# Patient Record
Sex: Female | Born: 1940
Health system: Southern US, Community
[De-identification: ages and names within clinical notes are randomized; demographics above are authoritative.]

## PROBLEM LIST (undated history)

## (undated) DIAGNOSIS — K219 Gastro-esophageal reflux disease without esophagitis: Secondary | ICD-10-CM

## (undated) DIAGNOSIS — F32A Depression, unspecified: Secondary | ICD-10-CM

## (undated) DIAGNOSIS — R7303 Prediabetes: Secondary | ICD-10-CM

## (undated) DIAGNOSIS — M1990 Unspecified osteoarthritis, unspecified site: Secondary | ICD-10-CM

## (undated) DIAGNOSIS — Z87898 Personal history of other specified conditions: Secondary | ICD-10-CM

## (undated) DIAGNOSIS — E785 Hyperlipidemia, unspecified: Secondary | ICD-10-CM

## (undated) DIAGNOSIS — F329 Major depressive disorder, single episode, unspecified: Secondary | ICD-10-CM

## (undated) DIAGNOSIS — I10 Essential (primary) hypertension: Secondary | ICD-10-CM

## (undated) DIAGNOSIS — G2581 Restless legs syndrome: Secondary | ICD-10-CM

## (undated) DIAGNOSIS — R200 Anesthesia of skin: Secondary | ICD-10-CM

## (undated) DIAGNOSIS — Z8669 Personal history of other diseases of the nervous system and sense organs: Secondary | ICD-10-CM

## (undated) HISTORY — PX: COLONOSCOPY: SHX174

## (undated) HISTORY — DX: Hyperlipidemia, unspecified: E78.5

## (undated) HISTORY — DX: Depression, unspecified: F32.A

## (undated) HISTORY — DX: Major depressive disorder, single episode, unspecified: F32.9

---

## 1982-03-17 HISTORY — PX: ABDOMINAL HYSTERECTOMY: SHX81

## 1999-12-10 ENCOUNTER — Other Ambulatory Visit: Admission: RE | Admit: 1999-12-10 | Discharge: 1999-12-10 | Payer: Self-pay | Admitting: Obstetrics and Gynecology

## 2000-12-10 ENCOUNTER — Other Ambulatory Visit: Admission: RE | Admit: 2000-12-10 | Discharge: 2000-12-10 | Payer: Self-pay | Admitting: Internal Medicine

## 2011-07-16 HISTORY — PX: CATARACT EXTRACTION: SUR2

## 2012-01-05 ENCOUNTER — Other Ambulatory Visit: Payer: Self-pay | Admitting: Obstetrics and Gynecology

## 2012-01-12 ENCOUNTER — Encounter (HOSPITAL_COMMUNITY): Payer: Self-pay | Admitting: Pharmacist

## 2012-01-22 ENCOUNTER — Encounter (HOSPITAL_COMMUNITY): Payer: Self-pay

## 2012-01-22 ENCOUNTER — Encounter (HOSPITAL_COMMUNITY)
Admission: RE | Admit: 2012-01-22 | Discharge: 2012-01-22 | Disposition: A | Discharge status: Home / Self Care | Payer: Medicare Other | Source: Ambulatory Visit | Attending: Obstetrics and Gynecology | Admitting: Obstetrics and Gynecology

## 2012-01-22 HISTORY — DX: Restless legs syndrome: G25.81

## 2012-01-22 HISTORY — DX: Essential (primary) hypertension: I10

## 2012-01-22 LAB — BASIC METABOLIC PANEL
CO2: 28 mEq/L (ref 19–32)
Calcium: 9.6 mg/dL (ref 8.4–10.5)
Creatinine, Ser: 0.55 mg/dL (ref 0.50–1.10)
GFR calc non Af Amer: >90 mL/min (ref >90)
Glucose, Bld: 88 mg/dL (ref 70–99)

## 2012-01-22 LAB — CBC
MCH: 31.7 pg (ref 26.0–34.0)
MCV: 92.9 fL (ref 78.0–100.0)
Platelets: 306 10*3/uL (ref 150–400)
RDW: 13.2 % (ref 11.5–15.5)

## 2012-01-22 NOTE — Patient Instructions (Addendum)
   Your procedure is scheduled on: Monday November 11th  Enter through the Main Entrance of [facility] at:11:00 am Pick up the phone at the desk and dial [phone] and inform us of your arrival.  Please call this number if you have any problems the morning of surgery: [phone]  Remember: Do not eat food after midnight on Sunday You may have water, gatorade, apple juice,  until 8:30 am on Monday then nothing Please take your blood pressure medicine morning of surgery  Do not wear jewelry, make-up, or FINGER nail polish No metal in your hair or on your body. Do not wear lotions, powders, perfumes. You may wear deodorant.  Please use your CHG wash as directed prior to surgery.  Do not shave anywhere for at least 12 hours prior to first CHG shower.  Do not bring valuables to the hospital. Please bring a case for your eyeglasses  Leave suitcase in the car. After Surgery it may be brought to your room. For patients being admitted to the hospital, checkout time is 11:00am the day of discharge.  Patients discharged on the day of surgery will not be allowed to drive home.

## 2012-01-22 NOTE — Pre-Procedure Instructions (Addendum)
Dr Delanna Ahmadi office called for EKG for comparison.(2004) Will do ekg today at pat visit.

## 2012-01-25 ENCOUNTER — Other Ambulatory Visit: Payer: Self-pay | Admitting: Obstetrics and Gynecology

## 2012-01-25 MED ORDER — CIPROFLOXACIN IN D5W 400 MG/200ML IV SOLN
400.0000 mg | INTRAVENOUS | Status: AC
  Administered 2012-01-26: 400 mg via INTRAVENOUS
  Filled 2012-01-25: qty 200

## 2012-01-25 MED ORDER — METRONIDAZOLE IN NACL 5-0.79 MG/ML-% IV SOLN
500.0000 mg | INTRAVENOUS | Status: AC
  Administered 2012-01-26: 500 mg via INTRAVENOUS
  Filled 2012-01-25: qty 100

## 2012-01-25 NOTE — H&P (Signed)
01/21/2012  History of Present Illness  General:  71 y/o G2P2 presents for preop for an anterior repair. Pt had complete prolapse of cystocele after lifting heavy cages with cats in them. She reported sensation of vaginal pressure that she reports as uncomfortable. She denies significant urinary incontinence or urgency prior to this incident. No abnormal discharge is noted. Pt has repeatedly inquired about the possibility of developing urinary incontinence after surgery. Although I don't think she will develop this symptom due to the presentation of her cystocele, she was previously offered a urology consult to evaluate for this and she declined. She declines again today at this preop visit and would like to go ahead and get it done. Pt is s/p hysterectomy for uterine fibroids and menorrhagia. Per my request, she has completed 6 weeks of twice weekly vaginal estrogen, Estrace, to prepare the vaginal mucosa for surgery. She also discontinued ASA 1 week ago to minimize bleeding.  She denies any fever/chills, n/v, abdominal pain, chest pain, SOB, numbness or tingling.  She has been cleared for surgery by her PCP, Dr. Carol Westerman.   Current Medications  Magnesium 400 MG Capsule 1 capsule with a meal Once a day  Lutein 6 MG Capsule 1 capsule twice a day  Vitamin D 1000 iu Capsule 1 tablet Once a day  Aspirin 81 MG Tablet Chewable 1 tablet Once a day  Multivitamins tablet Tablet 1 tablet once a day  OTC Blood Builder tablet 1 tablet 2-3 times a week  Vitamin B-12 1000 MCG Tablet 1 tablet Once a day  Red Yeast Rice 600 MG Capsule 2 tablets once a day  Fish Oil 2400 MG Capsule 1 capsule Once a day  Premarin 0.625 MG/GM Cream 1/2 appl twice weekly  Lisinopril 5 MG Tablet TAKE 1 TABLET BY MOUTH DAILY   Hydrochlorothiazide 25 MG Tablet take 1/2 tablet by mouth daily Once a day  Clonazepam 1 MG Tablet 1.5 tablet at bedtime at bedtime  Estrace 0.1 MG/GM Cream 2 grams 2 grams daily x 7 days then twice  weekly  Medication List reviewed and reconciled with the patient   Past Medical History  Hypertension  Osteoporosis? > resolved 2007 ?  Restless legs  Hyperlipidemia  Depression   Surgical History  s/p hysterectomy (benign) ovaries intact 1984  C-section x 2   bilateral cataract surgery 07-19-11   Family History  Father: deceased 60's yrs MI late 60's, heart disease   Mother: deceased 70's yrs lung/uterine cancer   Brother 1: alive 66 yrs A + W   Sister 1: alive 76 yrs melanoma, HTN   1 brother(s) , 1 sister(s) . 2daughter(s) - healthy.   Neg. f. hx. breast/colon cancer.   Social History  General:  History of smoking  cigarettes: Never smoked no Smoking.  no Tobacco Exposure.  no Alcohol.  Caffeine: yes, 2 servings daily, tea.  no Recreational drug use.  Diet: Vegetarian.  Exercise: yes, daily, walks.  Occupation: unemployed, homemaker/gardening/dogs/cats.  Marital Status: Married.  Children: 2 girls, 2 children; 4 grandchildren.  Seat belt use: yes.    Gyn History  Periods : hysterectomy, heavy bleeding, fibroids.  Denies H/O LMP .  Denies H/O Birth control .  Last mammogram date 12/05/11.    OB History  Number of pregnancies G2; P2.  Pregnancy # 1 live birth, C-section delivery, girl, 6 lbs 8oz. Labored for 8 hours.  Pregnancy # 2 live birth, C-section, girl, 6 lbs.    Allergies  Codeine (for allergy):   nausea: Side Effects  Penicillin (for allergy): rash: Allergy   Hospitalization/Major Diagnostic Procedure  see surg. hx.    Vital Signs  Wt 126, Wt change -1 lb, Ht 61.5, BMI 23.42, Pulse sitting 80, BP sitting 132/78.   Physical Examination  GENERAL:  Patient appears in NAD, pleasant.  Build: well developed.  General Appearance: well-appearing.  Race: caucasian.  LUNGS:  Breath sounds: clear to auscultation.  Dyspnea: no.  HEART:  Murmurs: none.  Rate: normal.  Rhythm: regular.  ABDOMEN:  General: no masses,tenderness,organomegaly, , BS normal,  non distended.  FEMALE GENITOURINARY:  Adnexa: no mass, non tender.  Anus/perineum: normal, no lesions.  Cervix/ cuff: cervix surgically absent, Cuff with some prolapse.  External genitalia: normal, no lesions, no skin discoloration.  Rectum: deferred.  Urethra: normal external meatus.  Uterus: surgically absent.  Vagina: no lesions, no abnormal discharge, odorless, atrophic mucosa but improved, estrogen cream noted, lower 1/3 of ant vagina stable but with Valsalva, mid 1/3 descends to the introitus. Introitus is accommodating.  Vulva: normal, no lesions, no skin discoloration.  EXTREMITIES:  Extremities no clubbing cyanosis or edema present.  NEUROLOGICAL:  gross motor and sensory grossly intact.  Orientation: alert and oriented x 3.     Assessments   1. Pre-op exam - V72.84 (Primary)   2. Cystocele - 618.01   Treatment  1. Cystocele  Discussed at length with pt rationale and R/B/A of surgery. She again declines pessary and urology referral. Informed of possible injury to bladder. Failure of repair requiring need of further surgery also discussed. Reviewed the importance of pelvic rest and lifting restricitons to avoid weakening the repair. Pt verbalizes understanding. All questions answered and consent obtained.    Follow Up  2 Weeks post op    

## 2012-01-26 ENCOUNTER — Ambulatory Visit (HOSPITAL_COMMUNITY)
Admission: AD | Admit: 2012-01-26 | Discharge: 2012-01-27 | Disposition: A | Discharge status: Home / Self Care | Payer: Medicare Other | Source: Ambulatory Visit | Attending: Obstetrics and Gynecology | Admitting: Obstetrics and Gynecology

## 2012-01-26 ENCOUNTER — Encounter (HOSPITAL_COMMUNITY): Payer: Self-pay | Admitting: Anesthesiology

## 2012-01-26 ENCOUNTER — Ambulatory Visit (HOSPITAL_COMMUNITY): Payer: Medicare Other | Admitting: Anesthesiology

## 2012-01-26 ENCOUNTER — Encounter (HOSPITAL_COMMUNITY): Payer: Self-pay | Admitting: *Deleted

## 2012-01-26 ENCOUNTER — Encounter (HOSPITAL_COMMUNITY)
Admission: AD | Disposition: A | Discharge status: Home / Self Care | Payer: Self-pay | Source: Ambulatory Visit | Attending: Obstetrics and Gynecology

## 2012-01-26 DIAGNOSIS — N993 Prolapse of vaginal vault after hysterectomy: Secondary | ICD-10-CM | POA: Insufficient documentation

## 2012-01-26 DIAGNOSIS — IMO0002 Reserved for concepts with insufficient information to code with codable children: Secondary | ICD-10-CM | POA: Diagnosis present

## 2012-01-26 HISTORY — PX: CYSTOCELE REPAIR: SHX163

## 2012-01-26 SURGERY — COLPORRHAPHY, ANTERIOR, FOR CYSTOCELE REPAIR
Anesthesia: General | Site: Vagina | Wound class: Clean Contaminated

## 2012-01-26 MED ORDER — HYDROMORPHONE HCL PF 1 MG/ML IJ SOLN: 0.2000 mg | INTRAMUSCULAR | Status: DC | PRN

## 2012-01-26 MED ORDER — HYDROCHLOROTHIAZIDE 12.5 MG PO CAPS
12.5000 mg | ORAL_CAPSULE | Freq: Every day | ORAL | Status: DC
  Filled 2012-01-26 (×2): qty 1

## 2012-01-26 MED ORDER — IBUPROFEN 600 MG PO TABS
600.0000 mg | ORAL_TABLET | Freq: Four times a day (QID) | ORAL | Status: DC | PRN
  Administered 2012-01-26: 600 mg via ORAL
  Filled 2012-01-26: qty 1

## 2012-01-26 MED ORDER — LISINOPRIL 5 MG PO TABS
5.0000 mg | ORAL_TABLET | Freq: Every day | ORAL | Status: DC
  Filled 2012-01-26 (×2): qty 1

## 2012-01-26 MED ORDER — DEXAMETHASONE SODIUM PHOSPHATE 10 MG/ML IJ SOLN
INTRAMUSCULAR | Status: AC
  Filled 2012-01-26: qty 1

## 2012-01-26 MED ORDER — CLONAZEPAM 0.5 MG PO TABS
1.5000 mg | ORAL_TABLET | Freq: Every day | ORAL | Status: DC
  Administered 2012-01-26: 1.5 mg via ORAL
  Filled 2012-01-26: qty 3

## 2012-01-26 MED ORDER — TRAMADOL HCL 50 MG PO TABS: 50.0000 mg | ORAL_TABLET | Freq: Four times a day (QID) | ORAL | Status: DC | PRN

## 2012-01-26 MED ORDER — KETOROLAC TROMETHAMINE 30 MG/ML IJ SOLN: 30.0000 mg | Freq: Once | INTRAMUSCULAR | Status: DC

## 2012-01-26 MED ORDER — LIDOCAINE HCL (CARDIAC) 20 MG/ML IV SOLN
INTRAVENOUS | Status: DC | PRN
  Administered 2012-01-26: 40 mg via INTRAVENOUS

## 2012-01-26 MED ORDER — DOCUSATE SODIUM 100 MG PO CAPS
100.0000 mg | ORAL_CAPSULE | Freq: Two times a day (BID) | ORAL | Status: DC
  Administered 2012-01-26 – 2012-01-27 (×2): 100 mg via ORAL
  Filled 2012-01-26 (×2): qty 1

## 2012-01-26 MED ORDER — ESTRADIOL 0.1 MG/GM VA CREA
TOPICAL_CREAM | VAGINAL | Status: DC | PRN
  Administered 2012-01-26: 1 via VAGINAL

## 2012-01-26 MED ORDER — MENTHOL 3 MG MT LOZG: 1.0000 | LOZENGE | OROMUCOSAL | Status: DC | PRN

## 2012-01-26 MED ORDER — DEXAMETHASONE SODIUM PHOSPHATE 10 MG/ML IJ SOLN
INTRAMUSCULAR | Status: DC | PRN
  Administered 2012-01-26: 10 mg via INTRAVENOUS

## 2012-01-26 MED ORDER — FENTANYL CITRATE 0.05 MG/ML IJ SOLN
INTRAMUSCULAR | Status: AC
  Filled 2012-01-26: qty 5

## 2012-01-26 MED ORDER — KETOROLAC TROMETHAMINE 30 MG/ML IJ SOLN: 15.0000 mg | Freq: Once | INTRAMUSCULAR | Status: DC | PRN

## 2012-01-26 MED ORDER — LACTATED RINGERS IV SOLN
INTRAVENOUS | Status: DC
  Administered 2012-01-26 – 2012-01-27 (×2): via INTRAVENOUS

## 2012-01-26 MED ORDER — MEPERIDINE HCL 25 MG/ML IJ SOLN: 6.2500 mg | INTRAMUSCULAR | Status: DC | PRN

## 2012-01-26 MED ORDER — LACTATED RINGERS IV SOLN
INTRAVENOUS | Status: DC
  Administered 2012-01-26: 14:00:00 via INTRAVENOUS
  Administered 2012-01-26 (×2): 50 mL/h via INTRAVENOUS

## 2012-01-26 MED ORDER — BUPIVACAINE-EPINEPHRINE PF 0.25-1:200000 % IJ SOLN
INTRAMUSCULAR | Status: AC
  Filled 2012-01-26: qty 30

## 2012-01-26 MED ORDER — FENTANYL CITRATE 0.05 MG/ML IJ SOLN
INTRAMUSCULAR | Status: DC | PRN
  Administered 2012-01-26: 50 ug via INTRAVENOUS
  Administered 2012-01-26: 25 ug via INTRAVENOUS
  Administered 2012-01-26: 100 ug via INTRAVENOUS
  Administered 2012-01-26: 25 ug via INTRAVENOUS
  Administered 2012-01-26: 50 ug via INTRAVENOUS

## 2012-01-26 MED ORDER — 0.9 % SODIUM CHLORIDE (POUR BTL) OPTIME
TOPICAL | Status: DC | PRN
  Administered 2012-01-26: 1000 mL

## 2012-01-26 MED ORDER — HYDROCHLOROTHIAZIDE 25 MG PO TABS
12.5000 mg | ORAL_TABLET | Freq: Every day | ORAL | Status: DC
  Filled 2012-01-26: qty 0.5

## 2012-01-26 MED ORDER — MIDAZOLAM HCL 2 MG/2ML IJ SOLN
INTRAMUSCULAR | Status: AC
  Filled 2012-01-26: qty 2

## 2012-01-26 MED ORDER — ONDANSETRON HCL 4 MG/2ML IJ SOLN
INTRAMUSCULAR | Status: DC | PRN
  Administered 2012-01-26: 4 mg via INTRAVENOUS

## 2012-01-26 MED ORDER — FENTANYL CITRATE 0.05 MG/ML IJ SOLN: 25.0000 ug | INTRAMUSCULAR | Status: DC | PRN

## 2012-01-26 MED ORDER — PROMETHAZINE HCL 25 MG/ML IJ SOLN: 6.2500 mg | INTRAMUSCULAR | Status: DC | PRN

## 2012-01-26 MED ORDER — BUPIVACAINE-EPINEPHRINE 0.25% -1:200000 IJ SOLN
INTRAMUSCULAR | Status: DC | PRN
  Administered 2012-01-26: 30 mL

## 2012-01-26 MED ORDER — SIMETHICONE 80 MG PO CHEW: 80.0000 mg | CHEWABLE_TABLET | Freq: Four times a day (QID) | ORAL | Status: DC | PRN

## 2012-01-26 MED ORDER — ONDANSETRON HCL 4 MG/2ML IJ SOLN
INTRAMUSCULAR | Status: AC
  Filled 2012-01-26: qty 2

## 2012-01-26 MED ORDER — LIDOCAINE HCL (CARDIAC) 20 MG/ML IV SOLN
INTRAVENOUS | Status: AC
  Filled 2012-01-26: qty 5

## 2012-01-26 MED ORDER — PROPOFOL 10 MG/ML IV BOLUS
INTRAVENOUS | Status: DC | PRN
  Administered 2012-01-26: 130 mg via INTRAVENOUS

## 2012-01-26 MED ORDER — PROPOFOL 10 MG/ML IV EMUL
INTRAVENOUS | Status: AC
  Filled 2012-01-26: qty 20

## 2012-01-26 MED ORDER — ESTRADIOL 0.1 MG/GM VA CREA
TOPICAL_CREAM | VAGINAL | Status: AC
  Filled 2012-01-26: qty 42.5

## 2012-01-26 MED ORDER — PANTOPRAZOLE SODIUM 40 MG PO TBEC
40.0000 mg | DELAYED_RELEASE_TABLET | Freq: Every day | ORAL | Status: DC
  Administered 2012-01-27: 40 mg via ORAL
  Filled 2012-01-26 (×3): qty 1

## 2012-01-26 MED ORDER — ONDANSETRON HCL 4 MG/2ML IJ SOLN
4.0000 mg | Freq: Four times a day (QID) | INTRAMUSCULAR | Status: DC | PRN
  Administered 2012-01-26: 4 mg via INTRAVENOUS
  Filled 2012-01-26: qty 2

## 2012-01-26 MED ORDER — MAGNESIUM OXIDE 400 (241.3 MG) MG PO TABS
400.0000 mg | ORAL_TABLET | Freq: Every day | ORAL | Status: DC
  Filled 2012-01-26 (×2): qty 1

## 2012-01-26 MED ORDER — ONDANSETRON HCL 4 MG PO TABS: 4.0000 mg | ORAL_TABLET | Freq: Four times a day (QID) | ORAL | Status: DC | PRN

## 2012-01-26 MED ORDER — MIDAZOLAM HCL 5 MG/5ML IJ SOLN
INTRAMUSCULAR | Status: DC | PRN
  Administered 2012-01-26: 2 mg via INTRAVENOUS

## 2012-01-26 MED ORDER — MIDAZOLAM HCL 2 MG/2ML IJ SOLN: 0.5000 mg | Freq: Once | INTRAMUSCULAR | Status: DC | PRN

## 2012-01-26 SURGICAL SUPPLY — 25 items
CATH ROBINSON RED A/P 16FR (CATHETERS) ×3 IMPLANT
CLOTH BEACON ORANGE TIMEOUT ST (SAFETY) ×3 IMPLANT
DRAPE HYSTEROSCOPY (DRAPE) ×3 IMPLANT
GAUZE PACKING 2X5 YD STERILE (GAUZE/BANDAGES/DRESSINGS) ×3 IMPLANT
GLOVE BIO SURGEON STRL SZ 6 (GLOVE) ×3 IMPLANT
GLOVE BIO SURGEON STRL SZ7 (GLOVE) ×3 IMPLANT
GLOVE BIOGEL M 6.5 STRL (GLOVE) ×3 IMPLANT
GLOVE BIOGEL PI IND STRL 7.0 (GLOVE) ×8 IMPLANT
GLOVE BIOGEL PI INDICATOR 7.0 (GLOVE) ×4
GLOVE ECLIPSE 7.0 STRL STRAW (GLOVE) ×3 IMPLANT
GOWN PREVENTION PLUS LG XLONG (DISPOSABLE) ×15 IMPLANT
NS IRRIG 1000ML POUR BTL (IV SOLUTION) ×3 IMPLANT
PACK VAGINAL WOMENS (CUSTOM PROCEDURE TRAY) ×3 IMPLANT
SUT VIC AB 0 CT1 18XCR BRD8 (SUTURE) ×2 IMPLANT
SUT VIC AB 0 CT1 27 (SUTURE)
SUT VIC AB 0 CT1 27XBRD ANBCTR (SUTURE) IMPLANT
SUT VIC AB 0 CT1 8-18 (SUTURE) ×1
SUT VIC AB 2-0 CT1 27 (SUTURE)
SUT VIC AB 2-0 CT1 TAPERPNT 27 (SUTURE) IMPLANT
SUT VIC AB 2-0 SH 27 (SUTURE) ×2
SUT VIC AB 2-0 SH 27XBRD (SUTURE) ×4 IMPLANT
SUT VIC AB 2-0 UR5 27 (SUTURE) IMPLANT
TOWEL OR 17X24 6PK STRL BLUE (TOWEL DISPOSABLE) ×6 IMPLANT
TRAY FOLEY CATH 14FR (SET/KITS/TRAYS/PACK) IMPLANT
WATER STERILE IRR 1000ML POUR (IV SOLUTION) ×3 IMPLANT

## 2012-01-26 NOTE — Brief Op Note (Signed)
01/26/2012  3:08 PM  PATIENT:  Ernestene Kiel  71 y.o. female  PRE-OPERATIVE DIAGNOSIS:  Cystocele  POST-OPERATIVE DIAGNOSIS:  cystocele  PROCEDURE:  Procedure(s) (LRB) with comments: ANTERIOR REPAIR (CYSTOCELE) (N/A)  SURGEON:  Surgeon(s) and Role:    * Geryl Rankins, MD - Primary    * Dorien Chihuahua. Richardson Dopp, MD - Assisting  PHYSICIAN ASSISTANT: Dr. Richardson Dopp  ASSISTANTS: Technician   ANESTHESIA:   general  EBL:  Total I/O In: 1000 [I.V.:1000] Out: 325 [Urine:250; Blood:75]  BLOOD ADMINISTERED:none  DRAINS: Urinary Catheter (Foley)   LOCAL MEDICATIONS USED:  MARCAINE   With epinephrine  SPECIMEN:  No Specimen  DISPOSITION OF SPECIMEN:  None  COUNTS:  YES  TOURNIQUET:  * No tourniquets in log *  DICTATION: .Other Dictation: Dictation Number [id number]  PLAN OF CARE: Admit for overnight observation  PATIENT DISPOSITION:  PACU - hemodynamically stable.   Delay start of Pharmacological VTE agent (>24hrs) due to surgical blood loss or risk of bleeding: not applicable

## 2012-01-26 NOTE — Anesthesia Postprocedure Evaluation (Signed)
Anesthesia Post Note  Patient: Lisa Morton  Procedure(s) Performed: Procedure(s) (LRB): ANTERIOR REPAIR (CYSTOCELE) (N/A)  Anesthesia type: GA  Patient location: PACU  Post pain: Pain level controlled  Post assessment: Post-op Vital signs reviewed  Last Vitals:  Filed Vitals:   01/26/12 1545  BP: 96/79  Pulse: 78  Temp:   Resp: 16    Post vital signs: Reviewed  Level of consciousness: sedated  Complications: No apparent anesthesia complications

## 2012-01-26 NOTE — H&P (View-Only) (Signed)
01/21/2012  History of Present Illness  General:  71 y/o G2P2 presents for preop for an anterior repair. Pt had complete prolapse of cystocele after lifting heavy cages with cats in them. She reported sensation of vaginal pressure that she reports as uncomfortable. She denies significant urinary incontinence or urgency prior to this incident. No abnormal discharge is noted. Pt has repeatedly inquired about the possibility of developing urinary incontinence after surgery. Although I don't think she will develop this symptom due to the presentation of her cystocele, she was previously offered a urology consult to evaluate for this and she declined. She declines again today at this preop visit and would like to go ahead and get it done. Pt is s/p hysterectomy for uterine fibroids and menorrhagia. Per my request, she has completed 6 weeks of twice weekly vaginal estrogen, Estrace, to prepare the vaginal mucosa for surgery. She also discontinued ASA 1 week ago to minimize bleeding.  She denies any fever/chills, n/v, abdominal pain, chest pain, SOB, numbness or tingling.  She has been cleared for surgery by her PCP, Dr. Silvestre Moment.   Current Medications  Magnesium 400 MG Capsule 1 capsule with a meal Once a day  Lutein 6 MG Capsule 1 capsule twice a day  Vitamin D 1000 iu Capsule 1 tablet Once a day  Aspirin 81 MG Tablet Chewable 1 tablet Once a day  Multivitamins tablet Tablet 1 tablet once a day  OTC Blood Builder tablet 1 tablet 2-3 times a week  Vitamin B-12 1000 MCG Tablet 1 tablet Once a day  Red Yeast Rice 600 MG Capsule 2 tablets once a day  Fish Oil 2400 MG Capsule 1 capsule Once a day  Premarin 0.625 MG/GM Cream 1/2 appl twice weekly  Lisinopril 5 MG Tablet TAKE 1 TABLET BY MOUTH DAILY   Hydrochlorothiazide 25 MG Tablet take 1/2 tablet by mouth daily Once a day  Clonazepam 1 MG Tablet 1.5 tablet at bedtime at bedtime  Estrace 0.1 MG/GM Cream 2 grams 2 grams daily x 7 days then twice  weekly  Medication List reviewed and reconciled with the patient   Past Medical History  Hypertension  Osteoporosis? > resolved 2007 ?  Restless legs  Hyperlipidemia  Depression   Surgical History  s/p hysterectomy (benign) ovaries intact 1984  C-section x 2   bilateral cataract surgery 07-19-11   Family History  Father: deceased 4's yrs MI late 15's, heart disease   Mother: deceased 71's yrs lung/uterine cancer   Brother 1: alive 20 yrs A + W   Sister 1: alive 7 yrs melanoma, HTN   1 brother(s) , 1 sister(s) . 2daughter(s) - healthy.   Neg. f. hx. breast/colon cancer.   Social History  General:  History of smoking  cigarettes: Never smoked no Smoking.  no Tobacco Exposure.  no Alcohol.  Caffeine: yes, 2 servings daily, tea.  no Recreational drug use.  Diet: Vegetarian.  Exercise: yes, daily, walks.  Occupation: unemployed, homemaker/gardening/dogs/cats.  Marital Status: Married.  Children: 2 girls, 2 children; 4 grandchildren.  Seat belt use: yes.    Gyn History  Periods : hysterectomy, heavy bleeding, fibroids.  Denies H/O LMP .  Denies H/O Birth control .  Last mammogram date 12/05/11.    OB History  Number of pregnancies G2; P2.  Pregnancy # 1 live birth, C-section delivery, girl, 6 lbs 8oz. Labored for 8 hours.  Pregnancy # 2 live birth, C-section, girl, 6 lbs.    Allergies  Codeine (for allergy):  nausea: Side Effects  Penicillin (for allergy): rash: Allergy   Hospitalization/Major Diagnostic Procedure  see surg. hx.    Vital Signs  Wt 126, Wt change -1 lb, Ht 61.5, BMI 23.42, Pulse sitting 80, BP sitting 132/78.   Physical Examination  GENERAL:  Patient appears in NAD, pleasant.  Build: well developed.  General Appearance: well-appearing.  Race: caucasian.  LUNGS:  Breath sounds: clear to auscultation.  Dyspnea: no.  HEART:  Murmurs: none.  Rate: normal.  Rhythm: regular.  ABDOMEN:  General: no masses,tenderness,organomegaly, , BS normal,  non distended.  FEMALE GENITOURINARY:  Adnexa: no mass, non tender.  Anus/perineum: normal, no lesions.  Cervix/ cuff: cervix surgically absent, Cuff with some prolapse.  External genitalia: normal, no lesions, no skin discoloration.  Rectum: deferred.  Urethra: normal external meatus.  Uterus: surgically absent.  Vagina: no lesions, no abnormal discharge, odorless, atrophic mucosa but improved, estrogen cream noted, lower 1/3 of ant vagina stable but with Valsalva, mid 1/3 descends to the introitus. Introitus is accommodating.  Vulva: normal, no lesions, no skin discoloration.  EXTREMITIES:  Extremities no clubbing cyanosis or edema present.  NEUROLOGICAL:  gross motor and sensory grossly intact.  Orientation: alert and oriented x 3.     Assessments   1. Pre-op exam - V72.84 (Primary)   2. Cystocele - 618.01   Treatment  1. Cystocele  Discussed at length with pt rationale and R/B/A of surgery. She again declines pessary and urology referral. Informed of possible injury to bladder. Failure of repair requiring need of further surgery also discussed. Reviewed the importance of pelvic rest and lifting restricitons to avoid weakening the repair. Pt verbalizes understanding. All questions answered and consent obtained.    Follow Up  2 Weeks post op

## 2012-01-26 NOTE — Interval H&P Note (Signed)
History and Physical Interval Note:  01/26/2012 1:18 PM  Lisa Morton  has presented today for surgery, with the diagnosis of Cystocele  The various methods of treatment have been discussed with the patient and family. After consideration of risks, benefits and other options for treatment, the patient has consented to  Procedure(s) (LRB) with comments: ANTERIOR (CYSTOCELE) AND POSTERIOR REPAIR (RECTOCELE) (N/A) - Anterior Repair  as a surgical intervention .  The patient's history has been reviewed, patient examined, no change in status, stable for surgery.  I have reviewed the patient's chart and labs.  Questions were answered to the patient's satisfaction.     Dion Body, Lisa Morton

## 2012-01-26 NOTE — Anesthesia Preprocedure Evaluation (Signed)
Anesthesia Evaluation  Patient identified by MRN, date of birth, ID band Patient awake    Reviewed: Allergy & Precautions, H&P , Patient's Chart, lab work & pertinent test results, reviewed documented beta blocker date and time   History of Anesthesia Complications Negative for: history of anesthetic complications  Airway Mallampati: II TM Distance: >3 FB Neck ROM: full    Dental No notable dental hx.    Pulmonary neg pulmonary ROS,  breath sounds clear to auscultation  Pulmonary exam normal       Cardiovascular Exercise Tolerance: Good hypertension, negative cardio ROS  Rhythm:regular Rate:Normal     Neuro/Psych negative neurological ROS  negative psych ROS   GI/Hepatic negative GI ROS, Neg liver ROS,   Endo/Other  negative endocrine ROS  Renal/GU negative Renal ROS     Musculoskeletal   Abdominal   Peds  Hematology negative hematology ROS (+)   Anesthesia Other Findings Hypertension     Restless leg syndrome   Reproductive/Obstetrics negative OB ROS                           Anesthesia Physical Anesthesia Plan  ASA: II  Anesthesia Plan: General LMA   Post-op Pain Management:    Induction:   Airway Management Planned:   Additional Equipment:   Intra-op Plan:   Post-operative Plan:   Informed Consent: I have reviewed the patients History and Physical, chart, labs and discussed the procedure including the risks, benefits and alternatives for the proposed anesthesia with the patient or authorized representative who has indicated his/her understanding and acceptance.   Dental Advisory Given  Plan Discussed with: CRNA and Surgeon  Anesthesia Plan Comments:         Anesthesia Quick Evaluation

## 2012-01-26 NOTE — Transfer of Care (Signed)
Immediate Anesthesia Transfer of Care Note  Patient: Lisa Morton  Procedure(s) Performed: Procedure(s) (LRB) with comments: ANTERIOR REPAIR (CYSTOCELE) (N/A)  Patient Location: PACU  Anesthesia Type:General  Level of Consciousness: sedated  Airway & Oxygen Therapy: Patient Spontanous Breathing and Patient connected to nasal cannula oxygen  Post-op Assessment: Report given to PACU RN and Post -op Vital signs reviewed and stable  Post vital signs: stable  Complications: No apparent anesthesia complications

## 2012-01-27 ENCOUNTER — Encounter (HOSPITAL_COMMUNITY): Payer: Self-pay | Admitting: Obstetrics and Gynecology

## 2012-01-27 DIAGNOSIS — IMO0002 Reserved for concepts with insufficient information to code with codable children: Secondary | ICD-10-CM | POA: Diagnosis present

## 2012-01-27 LAB — CBC
HCT: 34.5 % — ABNORMAL LOW (ref 36.0–46.0)
Platelets: 274 10*3/uL (ref 150–400)
RBC: 3.81 MIL/uL — ABNORMAL LOW (ref 3.87–5.11)
RDW: 13 % (ref 11.5–15.5)
WBC: 15.5 10*3/uL — ABNORMAL HIGH (ref 4.0–10.5)

## 2012-01-27 MED ORDER — IBUPROFEN 600 MG PO TABS: 600.0000 mg | ORAL_TABLET | Freq: Four times a day (QID) | ORAL | Status: DC | PRN

## 2012-01-27 MED ORDER — TRAMADOL HCL 50 MG PO TABS: 50.0000 mg | ORAL_TABLET | Freq: Four times a day (QID) | ORAL | Status: DC | PRN

## 2012-01-27 MED ORDER — DIPHENHYDRAMINE HCL 25 MG PO CAPS
50.0000 mg | ORAL_CAPSULE | Freq: Four times a day (QID) | ORAL | Status: DC | PRN
  Administered 2012-01-27: 50 mg via ORAL
  Filled 2012-01-27: qty 2

## 2012-01-27 NOTE — Progress Notes (Signed)
Ambulated out  Teaching complete  Pt has voided  adequately

## 2012-01-27 NOTE — Progress Notes (Signed)
1 Day Post-Op Procedure(s) (LRB): ANTERIOR REPAIR (CYSTOCELE) (N/A)  Subjective: Patient reports no problems voiding.  Pain is 0/10.  Scant bleeding on pad per RN.    Objective: I have reviewed patient's vital signs, intake and output and labs.  General: alert, cooperative and no distress GI: normal findings: soft, non-tender Extremities: extremities normal, atraumatic, no cyanosis or edema  Assessment: s/p Procedure(s) (LRB) with comments: ANTERIOR REPAIR (CYSTOCELE) (N/A): stable, progressing well and tolerating diet  Plan: Discharge home Pelvic rest and lifting restrictions. Hold BP medications if BP remains low.  Pt to take in am. Hold ASA at least 7 days to minimize bleeding.  LOS: 1 day    Lisa Morton 01/27/2012, 8:39 AM

## 2012-01-27 NOTE — Addendum Note (Signed)
Addendum  created 01/27/12 2130 by Shanon Payor, CRNA   Modules edited:Notes Section

## 2012-01-27 NOTE — Discharge Summary (Signed)
Physician Discharge Summary  Patient ID: Lisa Morton MRN: [id number] DOB/AGE: Jul 04, 1940 71 y.o.  Admit date: 01/26/2012 Discharge date: 01/27/2012  Admission Diagnoses:  Cystocele  Discharge Diagnoses: S/p Anterior repair Active Problems:  * No active hospital problems. *    Discharged Condition: good  Hospital Course: Pain well controlled overnight after surgery.  Pt did not require narcotics.  Packing removed with min-moderate amount of blood.  Spotting in am of discharge.  UOP normal.  Consults: None  Significant Diagnostic Studies: None  Treatments: surgery: See above  Discharge Exam: Blood pressure 110/48, pulse 73, temperature 97.4 F (36.3 C), temperature source Oral, resp. rate 16, height 5' 1.5" (1.562 m), weight 56.7 kg (125 lb), SpO2 98.00%. WNL day of discharge.  See progress note.  Disposition: Final discharge disposition not confirmed  Discharge Orders    Future Orders Please Complete By Expires   Diet - low sodium heart healthy      Increase activity slowly      Discharge instructions      Comments:   See discharge instructions.   Sexual Activity Restrictions      Comments:   Nothing in the vagina for 6 weeks.   Lifting restrictions      Comments:   Limit lifting over 15 pounds.   No wound care      Call MD for:  temperature >100.4      Call MD for:  persistant nausea and vomiting      Call MD for:  severe uncontrolled pain      Call MD for:  redness, tenderness, or signs of infection (pain, swelling, redness, odor or green/yellow discharge around incision site)      Call MD for:  extreme fatigue      Call MD for:  persistant dizziness or light-headedness          Medication List     As of 01/27/2012  8:40 AM    STOP taking these medications         aspirin EC 81 MG tablet      conjugated estrogens vaginal cream   Commonly known as: PREMARIN      TAKE these medications         CENTRUM SILVER ADULT 50+ PO   Take 1 tablet by mouth  daily.      cholecalciferol 1000 UNITS tablet   Commonly known as: VITAMIN D   Take 1,000 Units by mouth daily.      clonazePAM 1 MG tablet   Commonly known as: KLONOPIN   Take 1.5 mg by mouth at bedtime.      fish oil-omega-3 fatty acids 1000 MG capsule   Take 2 g by mouth daily.      hydrochlorothiazide 25 MG tablet   Commonly known as: HYDRODIURIL   Take 12.5 mg by mouth daily.      ibuprofen 600 MG tablet   Commonly known as: ADVIL,MOTRIN   Take 1 tablet (600 mg total) by mouth every 6 (six) hours as needed (mild pain).      lisinopril 5 MG tablet   Commonly known as: PRINIVIL,ZESTRIL   Take 5 mg by mouth daily.      Lutein 10 MG Tabs   Take 10 mg by mouth 3 (three) times a week.      magnesium oxide 400 MG tablet   Commonly known as: MAG-OX   Take 400 mg by mouth daily.      OVER THE COUNTER MEDICATION  Take 1 capsule by mouth 2 (two) times a week. Blood Builder- contains Vitamin C, Folate, Vitamin B12, and Iron. Takes on Wed and Sunday.      Red Yeast Rice 600 MG Caps   Take 1,200 mg by mouth daily.      traMADol 50 MG tablet   Commonly known as: ULTRAM   Take 1 tablet (50 mg total) by mouth every 6 (six) hours as needed.      vitamin B-12 1000 MCG tablet   Commonly known as: CYANOCOBALAMIN   Take 1,000 mcg by mouth daily. Takes every day except Wednesday and Sunday           Follow-up Information    Follow up with Geryl Rankins, MD. Schedule an appointment as soon as possible for a visit in 2 weeks. (Post op visit.)    Contact information:   [address], [address] [place] [address] [phone]          Signed: Geryl Rankins 01/27/2012, 8:40 AM

## 2012-01-27 NOTE — Anesthesia Postprocedure Evaluation (Signed)
  Anesthesia Post-op Note  Patient: Lisa Morton  Procedure(s) Performed: Procedure(s) (LRB) with comments: ANTERIOR REPAIR (CYSTOCELE) (N/A)  Patient Location: [facility]  Anesthesia Type:General  Level of Consciousness: awake, alert  and oriented  Airway and Oxygen Therapy: Patient Spontanous Breathing  Post-op Pain: none  Post-op Assessment: Post-op Vital signs reviewed and Patient's Cardiovascular Status Stable  Post-op Vital Signs: Reviewed and stable  Complications: No apparent anesthesia complications

## 2012-02-06 NOTE — Op Note (Signed)
[name], Lisa Morton              ACCOUNT NO.:  [id number]  MEDICAL RECORD NO.:  [id number]  LOCATION:  9319                          FACILITY:  [facility]  PHYSICIAN:  Pieter Partridge, MD   DATE OF BIRTH:  27-Nov-1940  DATE OF PROCEDURE:  01/26/2012 DATE OF DISCHARGE:  01/27/2012                              OPERATIVE REPORT   PREOPERATIVE DIAGNOSIS:  Cystocele.  POSTOPERATIVE DIAGNOSIS:  Cystocele.  PROCEDURE:  Anterior repair.  SURGEON:  Pieter Partridge, MD.  ASSISTANT:  Gerald Leitz, MD and technician.  ANESTHESIA:  General.  ESTIMATED BLOOD LOSS:  75.  URINE OUTPUT:  250 mL of clear urine.  DRAINS:  Foley catheter.  Local Marcaine without epinephrine.  SPECIMENS:  None.  DISPOSITION:  To PACU hemodynamically stable.  PLAN OF CARE:  Admit for overnight observation.  COMPLICATIONS:  None.  FINDINGS:  Grade 3-4 cystocele.  Previously atrophic mucosa and improved significantly status post estrogen therapy.  Repair of pelvic support status post repair was successful.  PROCEDURE IN DETAIL:  [name] was identified in the holding area.  She was then taken to the operating room with IV running.  She was placed in the dorsal supine position and underwent general endotracheal anesthesia without complication.  She was then placed in the dorsal lithotomy position and prepped and draped in the normal sterile fashion and positioned for the procedure.  The patient has had a hysterectomy so the apex of the vagina was palpated and then visualized and grasped at the corners with 2 Allis clamps.  A transverse incision was then made into the vaginal mucosa and the mucosa was then undermined with the Metzenbaum scissors and the incision was extended in a T-shape vertically up to the about 1-2 cm below the urethral meatus.  Of note, her cystocele was not involving the urethra and the defect started maybe 2 cm after that, so the incision was extended up to that point.  The space was  then opened with the Metzenbaum scissors and a gentle dissection was done to open up the space in the endopelvic fascia.  Once that was identified, a series of 0 Vicryl sutures and figure-of-eight fashion were placed, had 3 of those particular sutures that was then tied down to provide excellent pelvic support.  Once those sutures were tied, the vaginal mucosa was then trimmed with the curved Mayo scissors.  The mucosa was then brought together with 2-0 Vicryl in a continuous running fashion.  There was an appropriate amount of bleeding noted prior to placing my support sutures.  Once those were tied down the bleeding significantly decreased.  I did place a vaginal pack in the vagina with Estrace for adequate hemostasis that would be removed in approximately 6 hours postop.  The Foley catheter remained in place due to the vaginal packing.  The patient tolerated the procedure well.  All instrument, sponge, and needle counts were correct x3.  She got antibiotics prior to the procedure and had SCDs on throughout the case.     Pieter Partridge, MD     EBV/MEDQ  D:  02/05/2012  T:  02/06/2012  Job:  [id number]

## 2015-07-19 ENCOUNTER — Other Ambulatory Visit: Payer: Self-pay | Admitting: Family Medicine

## 2015-07-19 ENCOUNTER — Ambulatory Visit
Admission: RE | Admit: 2015-07-19 | Discharge: 2015-07-19 | Disposition: A | Discharge status: Home / Self Care | Payer: Medicare Other | Source: Ambulatory Visit | Attending: Family Medicine | Admitting: Family Medicine

## 2015-07-19 DIAGNOSIS — M545 Low back pain: Secondary | ICD-10-CM

## 2016-06-20 ENCOUNTER — Other Ambulatory Visit: Payer: Self-pay | Admitting: Family Medicine

## 2016-06-20 DIAGNOSIS — R4020 Unspecified coma: Secondary | ICD-10-CM

## 2016-06-23 ENCOUNTER — Other Ambulatory Visit: Payer: Self-pay | Admitting: Family Medicine

## 2016-06-23 DIAGNOSIS — R4020 Unspecified coma: Secondary | ICD-10-CM

## 2016-06-30 ENCOUNTER — Ambulatory Visit
Admission: RE | Admit: 2016-06-30 | Discharge: 2016-06-30 | Disposition: A | Discharge status: Home / Self Care | Payer: Medicare Other | Source: Ambulatory Visit | Attending: Family Medicine | Admitting: Family Medicine

## 2016-06-30 DIAGNOSIS — R4020 Unspecified coma: Secondary | ICD-10-CM

## 2016-07-01 ENCOUNTER — Ambulatory Visit
Admission: RE | Admit: 2016-07-01 | Discharge: 2016-07-01 | Disposition: A | Discharge status: Home / Self Care | Payer: Medicare Other | Source: Ambulatory Visit | Attending: Family Medicine | Admitting: Family Medicine

## 2016-07-01 DIAGNOSIS — R4020 Unspecified coma: Secondary | ICD-10-CM

## 2016-07-01 MED ORDER — GADOBENATE DIMEGLUMINE 529 MG/ML IV SOLN
12.0000 mL | Freq: Once | INTRAVENOUS | Status: AC | PRN
  Administered 2016-07-01: 12 mL via INTRAVENOUS

## 2016-07-10 ENCOUNTER — Encounter: Payer: Self-pay | Admitting: Neurology

## 2016-07-10 ENCOUNTER — Ambulatory Visit (INDEPENDENT_AMBULATORY_CARE_PROVIDER_SITE_OTHER): Payer: Medicare Other | Admitting: Neurology

## 2016-07-10 VITALS — BP 113/69 | HR 80 | Ht 62.0 in | Wt 125.0 lb

## 2016-07-10 DIAGNOSIS — M79605 Pain in left leg: Secondary | ICD-10-CM

## 2016-07-10 DIAGNOSIS — R29898 Other symptoms and signs involving the musculoskeletal system: Secondary | ICD-10-CM | POA: Diagnosis not present

## 2016-07-10 DIAGNOSIS — Z87898 Personal history of other specified conditions: Secondary | ICD-10-CM | POA: Diagnosis not present

## 2016-07-10 DIAGNOSIS — M545 Low back pain: Secondary | ICD-10-CM

## 2016-07-10 DIAGNOSIS — W19XXXS Unspecified fall, sequela: Secondary | ICD-10-CM

## 2016-07-10 NOTE — Patient Instructions (Addendum)
I am not sure, what caused your fall and the loss of consciousness. Your MRI brain and carotid doppler ultrasound were reassuring.  To be sure, we will do an EEG (brainwave test), which we will schedule. We will call you with the results. You have degenerative changes to your L spine and your left leg is weaker than the R.   We will do a lumbar spine (i.e. lower back) MRI to look for degenerative changes and for signs or nerve root compression.   We will do an EMG and nerve conduction velocity test, which is an electrical nerve and muscle test, which we will schedule. We will call you with the results.  We may consider a neck MRI if needed.

## 2016-07-10 NOTE — Progress Notes (Signed)
Subjective:    Patient ID: Lisa Morton is a 76 y.o. female.  HPI     Star Age, MD, PhD [facility] [address]O. [place], [place] [address]  Dear Dr. Inda Merlin,   I saw your patient, Lisa Morton, upon your kind request in my neurologic clinic today for initial consultation of her recent fall with loss of consciousness, concern for syncopal spell. The patient is unaccompanied today. As you know, Lisa Morton is a 76 year old right-handed woman with an underlying medical history of hyperlipidemia, depression, osteoporosis, hypertension, restless legs, who had a recent fall with loss of consciousness earlier this month. I reviewed your office note from 06/19/2016. She had a fall while walking the dog, she was on the phone with her daughter at the time, developed some shortness of breath, no chest pain or dizziness or vertigo but fell and went down on her knees and injured her face. She had some brief loss of consciousness as her daughter could not talked her any longer. She had facial pain. You ordered a brain MRI and carotid Doppler studies. She had a brain MRI with and without contrast on 07/01/2016 which I reviewed: IMPRESSION: No acute intracranial finding. Mild age related volume loss. No evidence of focal brain insult. Some fluid in the mastoid air cells on the right, usually not clinically relevant. She had carotid Doppler ultrasound bilaterally on 06/30/2016 and I reviewed the results: IMPRESSION: Less than 50% stenosis in the right and left internal carotid arteries.   She currently feels Close to baseline. She denies any lightheadedness, had eaten that day, does not recall having had any physical exhaustion. It was in the evening and she was walking the dog, it was close to dark and therefore she was on the phone with her daughter for safety. She usually does not talk on the phone and walk at the same time. She has no history of loss of  consciousness or Syncope before. She has a history of left-sided low back pain with radiation to the left leg. She had an x-ray to the lumbar spine in May 2017 which showed degenerative changes. She has not had an MRI lumbar spine. She also reports neck discomfort from time to time, crepitation in her neck and limitation to neck turns at times but no radiating pain typically to arms or legs. She does have some left leg weakness which she believes has been stable and not the result of the fall.She tries to stay well-hydrated. She had eaten supper that day. She had not taken her clonazepam and usually only takes it at bedtime.she denies any palpitations or chest pain at the time. She was not upset or stressed.   Her Past Medical History Is Significant For: Past Medical History:  Diagnosis Date  . Depression   . Hyperlipemia   . Hypertension   . Restless leg syndrome    on klonidipin    Her Past Surgical History Is Significant For: Past Surgical History:  Procedure Laterality Date  . ABDOMINAL HYSTERECTOMY  1984  . CATARACT EXTRACTION  07/2011  . CESAREAN SECTION    . CYSTOCELE REPAIR  01/26/2012   Procedure: ANTERIOR REPAIR (CYSTOCELE);  Surgeon: Thurnell Lose, MD;  Location: [facility];  Service: Gynecology;  Laterality: N/A;    Her Family History Is Significant For: No family history on file.  Her Social History Is Significant For: Social History   Social History  . Marital status: Married    Spouse name:  N/A  . Number of children: N/A  . Years of education: N/A   Social History Main Topics  . Smoking status: Never Smoker  . Smokeless tobacco: Never Used  . Alcohol use No  . Drug use: No  . Sexual activity: Not on file   Other Topics Concern  . Not on file   Social History Narrative  . No narrative on file    Her Allergies Are:  Allergies  Allergen Reactions  . Codeine Nausea And Vomiting  . Penicillins Rash  :   Her Current Medications Are:  Outpatient Encounter  Prescriptions as of 07/10/2016  Medication Sig  . aspirin EC 81 MG tablet Take 81 mg by mouth daily.  . Calcium-Magnesium-Zinc (CAL-MAG-ZINC PO) Take by mouth.  . cholecalciferol (VITAMIN D) 1000 UNITS tablet Take 1,000 Units by mouth daily.  . clonazePAM (KLONOPIN) 1 MG tablet Take 1 mg by mouth at bedtime.   . fish oil-omega-3 fatty acids 1000 MG capsule Take 2 g by mouth daily.  . hydrochlorothiazide (HYDRODIURIL) 25 MG tablet Take 12.5 mg by mouth daily.  Marland Kitchen ibuprofen (ADVIL,MOTRIN) 600 MG tablet Take 1 tablet (600 mg total) by mouth every 6 (six) hours as needed (mild pain).  Marland Kitchen lisinopril (PRINIVIL,ZESTRIL) 5 MG tablet Take 5 mg by mouth daily.  . Multiple Vitamins-Minerals (CENTRUM SILVER ADULT 50+ PO) Take 1 tablet by mouth daily.  Marland Kitchen OVER THE COUNTER MEDICATION Take 1 capsule by mouth 3 (three) times a week. Blood Builder- contains Vitamin C, Folate, Vitamin B12, and Iron. Takes on Wed and Sunday.  . Red Yeast Rice 600 MG CAPS Take 2,400 mg by mouth daily.   . sertraline (ZOLOFT) 100 MG tablet Take 100 mg by mouth daily.  . vitamin B-12 (CYANOCOBALAMIN) 1000 MCG tablet Take 1,000 mcg by mouth 4 (four) times a week.   . [DISCONTINUED] HYDROcodone-acetaminophen (NORCO/VICODIN) 5-325 MG tablet Take 1 tablet by mouth every 6 (six) hours as needed for moderate pain.  . [DISCONTINUED] Lutein 10 MG TABS Take 10 mg by mouth 3 (three) times a week.  . [DISCONTINUED] magnesium oxide (MAG-OX) 400 MG tablet Take 400 mg by mouth daily.  . [DISCONTINUED] traMADol (ULTRAM) 50 MG tablet Take 1 tablet (50 mg total) by mouth every 6 (six) hours as needed.   No facility-administered encounter medications on file as of 07/10/2016.    Review of Systems:  Out of a complete 14 point review of systems, all are reviewed and negative with the exception of these symptoms as listed below:  Review of Systems  Neurological:       Pt presents today to discuss a fall she had 3 weeks ago. She does not remember parts  of the event.    Objective:  Neurologic Exam  Physical Exam Physical Examination:   Vitals:   07/10/16 1002  BP: 113/69  Pulse: 80    General Examination: The patient is a very pleasant 76 y.o. female in no acute distress. She appears well-developed and well-nourished and well groomed. Mildly frail-appearing, mildly anxious appearing.  HEENT: Normocephalic, atraumatic, pupils are equal, round and reactive to light and accommodation. Funduscopic exam is normal with sharp disc margins noted. Extraocular tracking is good without limitation to gaze excursion or nystagmus noted. Normal smooth pursuit is noted. Hearing is grossly intact. Face is symmetric with normal facial animation and normal facial sensation. Speech is clear with no dysarthria noted. There is no hypophonia. There is no lip, neck/head, jaw or voice tremor. Neck is supple  but she does have decreased range of motion with limited neck turns, left is a little worse than right. She also complains of crepitation with neck turns at times. There are no carotid bruits on auscultation. Oropharynx exam reveals: mild mouth dryness, adequate dental hygiene and mild airway crowding, due to smaller airway. Mallampati is class II. Tongue protrudes centrally and palate elevates symmetrically.   Chest: Clear to auscultation without wheezing, rhonchi or crackles noted.  Heart: S1+S2+0, regular and normal without murmurs, rubs or gallops noted.   Abdomen: Soft, non-tender and non-distended with normal bowel sounds appreciated on auscultation.  Extremities: There is no pitting edema in the distal lower extremities bilaterally. Pedal pulses are intact.  Skin: Warm and dry without trophic changes noted.  Musculoskeletal: exam reveals no obvious joint deformities, tenderness or joint swelling or erythema.   Neurologically:  Mental status: The patient is awake, alert and oriented in all 4 spheres. Her immediate and remote memory, attention,  language skills and fund of knowledge are fairly appropriate. She is somewhat slow to respond at time.  Cranial nerves II - XII are as described above under HEENT exam. In addition: shoulder shrug is normal with equal shoulder height noted. Motor exam: thin bulk, global strength of 4+ out of 5, with the exception of left leg weakness noted particularly in the hip flexor. No foot drop. Romberg is negative. Reflexes are 2 to 3+ throughout. Babinski: Toes are flexor bilaterally. Fine motor skills and coordination: intact with normal finger taps, normal hand movements, normal rapid alternating patting, normal foot taps and normal foot agility.  Cerebellar testing: No dysmetria or intention tremor on finger to nose testing. Heel to shin is unremarkable bilaterally. There is no truncal or gait ataxia.  Sensory exam: intact to light touch, pinprick, vibration in the upper and lower extremities.  Gait, station and balance: She stands with mild difficulty, she walks cautiously and slowly, turns slowly, tandem walk is not possible safely. Posture appears to be age-appropriate. Assessment and Plan:    In summary, Lisa Morton is a very pleasant 76 y.o.-year old female with an underlying medical history of hyperlipidemia, depression, osteoporosis, hypertension, restless legs, who had a recent fall with loss of consciousness earlier this month. Differential diagnoses include syncope, mechanical fall with loss of consciousness, unclear exactly. I'm not sure that we are dealing with a primary neurological cause, workup with MRI brain and carotid Doppler testing from your and was benign. I suggested an EEG, workup for lower extremity weakness with EMG and nerve conduction testing and lumbar spine MRI.she has a history of low back pain radiating to the left and an x-ray last year showed degenerative changes. She also reports neck discomfort but is reluctant to proceed with the neck MRI, we will consider this down the road.  She is encouraged to consider seeing a cardiologist to rule out her cardiac cause of her spell. She denies any orthostatic lightheadedness or vertigo symptoms. I will see her back after her tests are completed and we will keep her posted as to sults via phone call. I answered all her questions today and she was in agreement with the plan. Thank you very much for allowing me to participate in the care of this nice patient. If I can be of any further assistance to you please do not hesitate to call me at [phone].  Sincerely,   Star Age, MD, PhD

## 2016-07-25 ENCOUNTER — Ambulatory Visit
Admission: RE | Admit: 2016-07-25 | Discharge: 2016-07-25 | Disposition: A | Discharge status: Home / Self Care | Payer: Medicare Other | Source: Ambulatory Visit | Attending: Neurology | Admitting: Neurology

## 2016-07-25 DIAGNOSIS — W19XXXS Unspecified fall, sequela: Secondary | ICD-10-CM

## 2016-07-25 DIAGNOSIS — R29898 Other symptoms and signs involving the musculoskeletal system: Secondary | ICD-10-CM

## 2016-07-25 DIAGNOSIS — M545 Low back pain: Secondary | ICD-10-CM | POA: Diagnosis not present

## 2016-07-25 DIAGNOSIS — M79605 Pain in left leg: Secondary | ICD-10-CM

## 2016-07-25 DIAGNOSIS — Z87898 Personal history of other specified conditions: Secondary | ICD-10-CM

## 2016-07-28 NOTE — Progress Notes (Signed)
Please call patient regarding her lumbar spine MRI results: She has degenerative changes in her lumbar spine at multiple levels, but no actual disc herniation and no significant nerve root compression, i.e. pinched nerves. If she has low back pain or issues with sciatica/radiating back pain, she may benefit from seeing a spine specialist such as orthopedics or neurosurgery. She is encouraged to discuss with PCP as well.  No further action required from my end on this test.  Star Age, MD, PhD [facility] ([place])

## 2016-07-30 ENCOUNTER — Telehealth: Payer: Self-pay

## 2016-07-30 NOTE — Telephone Encounter (Signed)
Patient called office returning RN's call.  Please call °

## 2016-07-30 NOTE — Telephone Encounter (Signed)
-----   Message from Star Age, MD sent at 07/28/2016  1:44 PM EDT ----- Please call patient regarding her lumbar spine MRI results: She has degenerative changes in her lumbar spine at multiple levels, but no actual disc herniation and no significant nerve root compression, i.e. pinched nerves. If she has low back pain or issues with sciatica/radiating back pain, she may benefit from seeing a spine specialist such as orthopedics or neurosurgery. She is encouraged to discuss with PCP as well.  No further action required from my end on this test.  Star Age, MD, PhD [facility] ([place])

## 2016-07-30 NOTE — Telephone Encounter (Signed)
I spoke to patient and she is aware of results and recommendations.  

## 2016-07-30 NOTE — Telephone Encounter (Signed)
LM for patient to call back.

## 2016-08-06 ENCOUNTER — Other Ambulatory Visit: Payer: Medicare Other

## 2016-08-07 ENCOUNTER — Ambulatory Visit (INDEPENDENT_AMBULATORY_CARE_PROVIDER_SITE_OTHER): Payer: Medicare Other | Admitting: Diagnostic Neuroimaging

## 2016-08-07 DIAGNOSIS — R55 Syncope and collapse: Secondary | ICD-10-CM

## 2016-08-07 DIAGNOSIS — W19XXXS Unspecified fall, sequela: Secondary | ICD-10-CM

## 2016-08-07 DIAGNOSIS — Z87898 Personal history of other specified conditions: Secondary | ICD-10-CM

## 2016-08-07 DIAGNOSIS — M79605 Pain in left leg: Secondary | ICD-10-CM

## 2016-08-07 DIAGNOSIS — R29898 Other symptoms and signs involving the musculoskeletal system: Secondary | ICD-10-CM

## 2016-08-07 DIAGNOSIS — M545 Low back pain: Secondary | ICD-10-CM

## 2016-08-13 NOTE — Procedures (Signed)
   [facility]  EEG (ELECTROENCEPHALOGRAM) REPORT   STUDY DATE: 08/07/16 PATIENT NAME: Lisa Morton DOB: [birth year] MRN: [id number]  ORDERING CLINICIAN: Star Age, MD PhD   TECHNOLOGIST: Nonda Lou TECHNIQUE: Electroencephalogram was recorded utilizing standard 10-20 system of lead placement and reformatted into average and bipolar montages.  RECORDING TIME: 21 minutes  ACTIVATION: hyperventilation and photic stimulation  CLINICAL INFORMATION: 76 year old female with syncope.  FINDINGS: Background rhythms of 10-12 hertz and 40-50 microvolts. No focal, lateralizing, epileptiform activity or seizures are seen. Patient recorded in the awake and drowsy state. EKG channel shows regular rhythms of 60-70 beats per minute.   IMPRESSION:  Normal EEG in the awake and drowsy states.    INTERPRETING PHYSICIAN:  Penni Bombard, MD Certified in Neurology, Neurophysiology and Neuroimaging  [facility] [address] [place], [place] [address] [phone]

## 2016-08-14 ENCOUNTER — Telehealth: Payer: Self-pay

## 2016-08-14 ENCOUNTER — Ambulatory Visit (INDEPENDENT_AMBULATORY_CARE_PROVIDER_SITE_OTHER): Payer: Self-pay | Admitting: Neurology

## 2016-08-14 ENCOUNTER — Encounter (INDEPENDENT_AMBULATORY_CARE_PROVIDER_SITE_OTHER): Payer: Self-pay

## 2016-08-14 ENCOUNTER — Ambulatory Visit (INDEPENDENT_AMBULATORY_CARE_PROVIDER_SITE_OTHER): Payer: Medicare Other | Admitting: Neurology

## 2016-08-14 DIAGNOSIS — M79605 Pain in left leg: Secondary | ICD-10-CM

## 2016-08-14 DIAGNOSIS — M545 Low back pain, unspecified: Secondary | ICD-10-CM

## 2016-08-14 DIAGNOSIS — Z87898 Personal history of other specified conditions: Secondary | ICD-10-CM

## 2016-08-14 DIAGNOSIS — W19XXXS Unspecified fall, sequela: Secondary | ICD-10-CM

## 2016-08-14 DIAGNOSIS — Z0289 Encounter for other administrative examinations: Secondary | ICD-10-CM

## 2016-08-14 DIAGNOSIS — R29898 Other symptoms and signs involving the musculoskeletal system: Secondary | ICD-10-CM

## 2016-08-14 NOTE — Progress Notes (Signed)
See procedure note.

## 2016-08-14 NOTE — Telephone Encounter (Signed)
-----   Message from Star Age, MD sent at 08/14/2016  7:39 AM EDT ----- Please call and advise the patient that the EEG or brain wave test we performed was reported as normal in the awake and drowsy states. We checked for abnormal electrical discharges in the brain waves and the report suggested normal findings. No further action is required on this test at this time. Please remind patient to keep any upcoming appointments or tests and to call us with any interim questions, concerns, problems or updates. Thanks,  Star Age, MD, PhD

## 2016-08-14 NOTE — Procedures (Signed)
Full Name: Lisa Morton Single Gender: Female MRN #: [id number] Date of Birth: 05/16/1940    Visit Date: 08/14/2016 10:44 Age: 77 Years 3 Months Old Examining Physician: Sarina Ill, MD Referring physician: Star Age, MD  History: This is a 76 year old female here for evaluation of bilateral leg weakness  Summary: EMG nerve conduction study was performed on the bilateral lower extremities. All nerves and muscles (as detailed in the tables below) were within normal limits.    Conclusion: This is a normal study. No electrophysiologic evidence for mononeuropathy, polyneuropathy, radiculopathy, myopathy/myositis or other neuromuscular disorders.  Cc: Star Age, MD  Sarina Ill, M.D.  [facility] [place], [place] [address] Tel: [phone] Fax: [phone]        [facility]    Nerve / Sites Rec. Site Latency Ref. Amplitude Ref. Rel Amp Segments Distance Velocity Ref. Area    ms ms mV mV %  cm m/s m/s mVms  R Peroneal - EDB     Ankle EDB 4.3 ?6.5 3.0 ?2.0 100 Ankle - EDB 9   5.7     Fib head EDB 10.2  2.0  67.7 Fib head - Ankle 28 48 ?44 5.1     Pop fossa EDB 12.0  2.0  101 Pop fossa - Fib head 10 55 ?44 5.5         Pop fossa - Ankle      L Peroneal - EDB     Ankle EDB 4.7 ?6.5 1.8 ?2.0 100 Ankle - EDB 9   4.4     Fib head EDB 10.5  1.4  75.9 Fib head - Ankle 29 51 ?44 3.9     Pop fossa EDB 12.9  0.7  52.5 Pop fossa - Fib head 10 42 ?44 2.0         Pop fossa - Ankle      R Tibial - AH     Ankle AH 4.3 ?5.8 9.3 ?4.0 100 Ankle - AH 9   16.3     Pop fossa AH 11.8  6.2  66.6 Pop fossa - Ankle 35 46 ?41 14.3  L Tibial - AH     Ankle AH 4.3 ?5.8 9.9 ?4.0 100 Ankle - AH 9   16.9     Pop fossa AH 11.6  7.3  73.6 Pop fossa - Ankle 35 48 ?41 14.1             SNC    Nerve / Sites Rec. Site Peak Lat Ref.  Amp Ref. Segments Distance    ms ms V V  cm  R Sural - Ankle (Calf)     Calf Ankle 4.06 ?4.40 8 ?6 Calf - Ankle 14  L Sural - Ankle (Calf)    Calf Ankle 4.38 ?4.40 8 ?6 Calf - Ankle 14  R Superficial peroneal - Ankle     Lat leg Ankle 3.91 ?4.40 8 ?6 Lat leg - Ankle 14  L Superficial peroneal - Ankle     Lat leg Ankle 3.75 ?4.40 6 ?6 Lat leg - Ankle 14             F  Wave    Nerve F Lat Ref.   ms ms  R Tibial - AH 48.0 ?56.0  L Tibial - AH 47.8 ?56.0         EMG full       EMG Summary Table    Spontaneous MUAP Recruitment  Muscle IA Fib PSW Fasc Other Amp Dur. Poly Pattern  L. Iliopsoas Normal None None None _______ Normal Normal Normal Normal  L. Vastus lateralis Normal None None None _______ Normal Normal Normal Normal  L. Vastus medialis Normal None None None _______ Normal Normal Normal Normal  L. Tibialis anterior Normal None None None _______ Normal Normal Normal Normal  L. Gastrocnemius (Medial head) Normal None None None _______ Normal Normal Normal Normal  L. Extensor hallucis longus Normal None None None _______ Normal Normal Normal Normal  L. Biceps femoris (long head) Normal None None None _______ Normal Normal Normal Normal  L. Gluteus maximus Normal None None None _______ Normal Normal Normal Normal  Bilateral Lumbar paraspinals (low) Normal None None None _______ Normal Normal Normal Normal  R. Iliopsoas Normal None None None _______ Normal Normal Normal Normal  R. Vastus lateralis Normal None None None _______ Normal Normal Normal Normal  R. Vastus medialis Normal None None None _______ Normal Normal Normal Normal  R. Tibialis anterior Normal None None None _______ Normal Normal Normal Normal  R. Gastrocnemius (Medial head) Normal None None None _______ Normal Normal Normal Normal  R. Extensor hallucis longus Normal None None None _______ Normal Normal Normal Normal  R. Biceps femoris (long head) Normal None None None _______ Normal Normal Normal Normal  R. Gluteus maximus Normal None None None _______ Normal Normal Normal Normal

## 2016-08-14 NOTE — Progress Notes (Signed)
Please call and advise the patient that the EEG or brain wave test we performed was reported as normal in the awake and drowsy states. We checked for abnormal electrical discharges in the brain waves and the report suggested normal findings. No further action is required on this test at this time. Please remind patient to keep any upcoming appointments or tests and to call us with any interim questions, concerns, problems or updates. Thanks,  Hannibal Skalla, MD, PhD  

## 2016-08-14 NOTE — Progress Notes (Signed)
Full Name: Lisa Morton Gender: Female MRN #: [id number] Date of Birth: 09/25/1940    Visit Date: 08/14/2016 10:44 Age: 76 Years 3 Months Old Examining Physician: Sarina Ill, MD Referring physician: Star Age, MD  History: This is a 76 year old female here for evaluation of bilateral leg weakness  Summary: EMG nerve conduction study was performed on the bilateral lower extremities. All nerves and muscles (as detailed in the tables below) were within normal limits.    Conclusion: This is a normal study. No electrophysiologic evidence for mononeuropathy, polyneuropathy, radiculopathy, myopathy/myositis or other neuromuscular disorders.  Cc: Star Age, MD  Sarina Ill, M.D.  [facility] [place],  [address] Tel: [phone] Fax: [phone]        [facility]    Nerve / Sites Rec. Site Latency Ref. Amplitude Ref. Rel Amp Segments Distance Velocity Ref. Area    ms ms mV mV %  cm m/s m/s mVms  R Peroneal - EDB     Ankle EDB 4.3 ?6.5 3.0 ?2.0 100 Ankle - EDB 9   5.7     Fib head EDB 10.2  2.0  67.7 Fib head - Ankle 28 48 ?44 5.1     Pop fossa EDB 12.0  2.0  101 Pop fossa - Fib head 10 55 ?44 5.5         Pop fossa - Ankle      L Peroneal - EDB     Ankle EDB 4.7 ?6.5 1.8 ?2.0 100 Ankle - EDB 9   4.4     Fib head EDB 10.5  1.4  75.9 Fib head - Ankle 29 51 ?44 3.9     Pop fossa EDB 12.9  0.7  52.5 Pop fossa - Fib head 10 42 ?44 2.0         Pop fossa - Ankle      R Tibial - AH     Ankle AH 4.3 ?5.8 9.3 ?4.0 100 Ankle - AH 9   16.3     Pop fossa AH 11.8  6.2  66.6 Pop fossa - Ankle 35 46 ?41 14.3  L Tibial - AH     Ankle AH 4.3 ?5.8 9.9 ?4.0 100 Ankle - AH 9   16.9     Pop fossa AH 11.6  7.3  73.6 Pop fossa - Ankle 35 48 ?41 14.1             SNC    Nerve / Sites Rec. Site Peak Lat Ref.  Amp Ref. Segments Distance    ms ms V V  cm  R Sural - Ankle (Calf)     Calf Ankle 4.06 ?4.40 8 ?6 Calf - Ankle 14  L Sural - Ankle (Calf)    Calf Ankle 4.38 ?4.40 8 ?6 Calf - Ankle 14  R Superficial peroneal - Ankle     Lat leg Ankle 3.91 ?4.40 8 ?6 Lat leg - Ankle 14  L Superficial peroneal - Ankle     Lat leg Ankle 3.75 ?4.40 6 ?6 Lat leg - Ankle 14             F  Wave    Nerve F Lat Ref.   ms ms  R Tibial - AH 48.0 ?56.0  L Tibial - AH 47.8 ?56.0         EMG full       EMG Summary Table    Spontaneous MUAP Recruitment  Muscle IA Fib PSW Fasc Other Amp Dur. Poly Pattern  L. Iliopsoas Normal None None None _______ Normal Normal Normal Normal  L. Vastus lateralis Normal None None None _______ Normal Normal Normal Normal  L. Vastus medialis Normal None None None _______ Normal Normal Normal Normal  L. Tibialis anterior Normal None None None _______ Normal Normal Normal Normal  L. Gastrocnemius (Medial head) Normal None None None _______ Normal Normal Normal Normal  L. Extensor hallucis longus Normal None None None _______ Normal Normal Normal Normal  L. Biceps femoris (long head) Normal None None None _______ Normal Normal Normal Normal  L. Gluteus maximus Normal None None None _______ Normal Normal Normal Normal  Bilateral Lumbar paraspinals (low) Normal None None None _______ Normal Normal Normal Normal  R. Iliopsoas Normal None None None _______ Normal Normal Normal Normal  R. Vastus lateralis Normal None None None _______ Normal Normal Normal Normal  R. Vastus medialis Normal None None None _______ Normal Normal Normal Normal  R. Tibialis anterior Normal None None None _______ Normal Normal Normal Normal  R. Gastrocnemius (Medial head) Normal None None None _______ Normal Normal Normal Normal  R. Extensor hallucis longus Normal None None None _______ Normal Normal Normal Normal  R. Biceps femoris (long head) Normal None None None _______ Normal Normal Normal Normal  R. Gluteus maximus Normal None None None _______ Normal Normal Normal Normal

## 2016-08-14 NOTE — Telephone Encounter (Signed)
-----   Message from Laurence Spates, RN sent at 08/14/2016  4:45 PM EDT -----   ----- Message ----- From: Star Age, MD Sent: 08/14/2016   3:51 PM To: Laurence Spates, RN  Please call and advise the patient that the recent EMG and nerve conduction velocity test, which is the electrical nerve and muscle test we we performed, was reported as within normal limits. We checked for abnormal electrical discharges in the muscles or nerves and the report suggested normal findings. No further action is required on this test at this time. Please remind patient to keep any upcoming appointments or tests and to call us with any interim questions, concerns, problems or updates. Thanks,  Star Age, MD, PhD

## 2016-08-14 NOTE — Telephone Encounter (Signed)
I called pt to discuss her NCV results. No answer, left a message asking her to call me back. 

## 2016-08-14 NOTE — Telephone Encounter (Signed)
LM (per DPR) with results below. Left call back number for any questions.  

## 2016-08-14 NOTE — Progress Notes (Signed)
Please call and advise the patient that the recent EMG and nerve conduction velocity test, which is the electrical nerve and muscle test we we performed, was reported as within normal limits. We checked for abnormal electrical discharges in the muscles or nerves and the report suggested normal findings. No further action is required on this test at this time. Please remind patient to keep any upcoming appointments or tests and to call us with any interim questions, concerns, problems or updates. Thanks,  Dary Dilauro, MD, PhD 

## 2016-08-18 NOTE — Telephone Encounter (Signed)
I have spoken with Lisa Morton this morning and per AA, reviewed EMG results as below.  She verbalized understanding of same/fim

## 2016-10-09 ENCOUNTER — Ambulatory Visit: Payer: Medicare Other | Admitting: Neurology

## 2017-11-20 ENCOUNTER — Encounter: Payer: Self-pay | Admitting: Cardiology

## 2017-12-29 ENCOUNTER — Encounter (HOSPITAL_COMMUNITY): Payer: Self-pay

## 2017-12-29 ENCOUNTER — Emergency Department (HOSPITAL_COMMUNITY)
Admission: EM | Admit: 2017-12-29 | Discharge: 2017-12-30 | Disposition: A | Discharge status: Home / Self Care | Payer: Medicare Other | Attending: Emergency Medicine | Admitting: Emergency Medicine

## 2017-12-29 ENCOUNTER — Other Ambulatory Visit: Payer: Self-pay

## 2017-12-29 ENCOUNTER — Emergency Department (HOSPITAL_COMMUNITY): Payer: Medicare Other

## 2017-12-29 DIAGNOSIS — S0081XA Abrasion of other part of head, initial encounter: Secondary | ICD-10-CM | POA: Diagnosis not present

## 2017-12-29 DIAGNOSIS — Y999 Unspecified external cause status: Secondary | ICD-10-CM | POA: Insufficient documentation

## 2017-12-29 DIAGNOSIS — R55 Syncope and collapse: Secondary | ICD-10-CM

## 2017-12-29 DIAGNOSIS — X58XXXA Exposure to other specified factors, initial encounter: Secondary | ICD-10-CM | POA: Insufficient documentation

## 2017-12-29 DIAGNOSIS — I10 Essential (primary) hypertension: Secondary | ICD-10-CM | POA: Diagnosis not present

## 2017-12-29 DIAGNOSIS — Y929 Unspecified place or not applicable: Secondary | ICD-10-CM | POA: Diagnosis not present

## 2017-12-29 DIAGNOSIS — Y939 Activity, unspecified: Secondary | ICD-10-CM | POA: Insufficient documentation

## 2017-12-29 DIAGNOSIS — Z79899 Other long term (current) drug therapy: Secondary | ICD-10-CM | POA: Diagnosis not present

## 2017-12-29 DIAGNOSIS — Z7982 Long term (current) use of aspirin: Secondary | ICD-10-CM | POA: Insufficient documentation

## 2017-12-29 DIAGNOSIS — S022XXA Fracture of nasal bones, initial encounter for closed fracture: Secondary | ICD-10-CM

## 2017-12-29 DIAGNOSIS — T07XXXA Unspecified multiple injuries, initial encounter: Secondary | ICD-10-CM

## 2017-12-29 LAB — CBC WITH DIFFERENTIAL/PLATELET
Abs Immature Granulocytes: 0.05 10*3/uL (ref 0.00–0.07)
BASOS PCT: 1 %
Basophils Absolute: 0.1 10*3/uL (ref 0.0–0.1)
Eosinophils Absolute: 0.1 10*3/uL (ref 0.0–0.5)
Eosinophils Relative: 1 %
HCT: 38.7 % (ref 36.0–46.0)
Hemoglobin: 12.7 g/dL (ref 12.0–15.0)
Immature Granulocytes: 0 %
Lymphocytes Relative: 12 %
Lymphs Abs: 1.4 10*3/uL (ref 0.7–4.0)
MCH: 30.8 pg (ref 26.0–34.0)
MCHC: 32.8 g/dL (ref 30.0–36.0)
MCV: 93.9 fL (ref 80.0–100.0)
MONO ABS: 1 10*3/uL (ref 0.1–1.0)
MONOS PCT: 8 %
NEUTROS ABS: 9.2 10*3/uL — AB (ref 1.7–7.7)
Neutrophils Relative %: 78 %
PLATELETS: 260 10*3/uL (ref 150–400)
RBC: 4.12 MIL/uL (ref 3.87–5.11)
RDW: 12.7 % (ref 11.5–15.5)
WBC: 11.9 10*3/uL — AB (ref 4.0–10.5)
nRBC: 0 % (ref 0.0–0.2)

## 2017-12-29 LAB — BASIC METABOLIC PANEL
Anion gap: 10 (ref 5–15)
BUN: 18 mg/dL (ref 8–23)
CALCIUM: 9.4 mg/dL (ref 8.9–10.3)
CHLORIDE: 100 mmol/L (ref 98–111)
CO2: 22 mmol/L (ref 22–32)
CREATININE: 0.69 mg/dL (ref 0.44–1.00)
GFR calc non Af Amer: >60 mL/min (ref >60)
Glucose, Bld: 138 mg/dL — ABNORMAL HIGH (ref 70–99)
Potassium: 3.3 mmol/L — ABNORMAL LOW (ref 3.5–5.1)
SODIUM: 132 mmol/L — AB (ref 135–145)

## 2017-12-29 LAB — CBG MONITORING, ED: Glucose-Capillary: 116 mg/dL — ABNORMAL HIGH (ref 70–99)

## 2017-12-29 MED ORDER — POTASSIUM CHLORIDE CRYS ER 20 MEQ PO TBCR
40.0000 meq | EXTENDED_RELEASE_TABLET | Freq: Once | ORAL | Status: AC
  Administered 2017-12-29: 40 meq via ORAL
  Filled 2017-12-29: qty 2

## 2017-12-29 MED ORDER — MAGNESIUM SULFATE 2 GM/50ML IV SOLN
2.0000 g | Freq: Once | INTRAVENOUS | Status: AC
  Administered 2017-12-29: 2 g via INTRAVENOUS
  Filled 2017-12-29: qty 50

## 2017-12-29 NOTE — ED Triage Notes (Addendum)
Pt arrives EMS from home with c/o fall while walking dog. Pt does not remember fall  C/o pain to face and right hand. Laceration to right eyebrow. No bleeding now, arrives with towel roll to neck. Abrasion at right hand.

## 2017-12-29 NOTE — ED Notes (Signed)
CBG collected. Result "116." RN, Millie, notified.

## 2017-12-29 NOTE — ED Notes (Signed)
Patient transported to CT 

## 2017-12-29 NOTE — ED Notes (Signed)
Pt back from CT

## 2017-12-29 NOTE — ED Notes (Signed)
Pt does not have a pacemaker or ICD.

## 2017-12-29 NOTE — ED Provider Notes (Signed)
[facility] Provider Note   CSN: [id number] Arrival date & time: 12/29/17  1825     History   Chief Complaint Chief Complaint  Patient presents with  . Fall  . Loss of Consciousness    HPI Lisa Morton is a 77 y.o. female.   Loss of Consciousness   This is a recurrent problem. The current episode started 1 to 2 hours ago. The problem occurs constantly. The problem has been resolved. She lost consciousness for a period of 1 to 5 minutes. The problem is associated with normal activity. Pertinent negatives include fever. She has tried nothing for the symptoms. The treatment provided no relief.    Past Medical History:  Diagnosis Date  . Depression   . Hyperlipemia   . Hypertension   . Restless leg syndrome    on klonidipin    Patient Active Problem List   Diagnosis Date Noted  . Cystocele 01/27/2012    Past Surgical History:  Procedure Laterality Date  . ABDOMINAL HYSTERECTOMY  1984  . CATARACT EXTRACTION  07/2011  . CESAREAN SECTION    . CYSTOCELE REPAIR  01/26/2012   Procedure: ANTERIOR REPAIR (CYSTOCELE);  Surgeon: Thurnell Lose, MD;  Location: [facility];  Service: Gynecology;  Laterality: N/A;     OB History   None      Home Medications    Prior to Admission medications   Medication Sig Start Date End Date Taking? Authorizing Provider  aspirin EC 81 MG tablet Take 81 mg by mouth daily.    [provider]  Calcium-Magnesium-Zinc (CAL-MAG-ZINC PO) Take by mouth.    [provider]  cholecalciferol (VITAMIN D) 1000 UNITS tablet Take 1,000 Units by mouth daily.    [provider]  clonazePAM (KLONOPIN) 1 MG tablet Take 1 mg by mouth at bedtime.     [provider]  fish oil-omega-3 fatty acids 1000 MG capsule Take 2 g by mouth daily.    [provider]  hydrochlorothiazide (HYDRODIURIL) 25 MG tablet Take 12.5 mg by mouth daily.    [provider]  ibuprofen  (ADVIL,MOTRIN) 600 MG tablet Take 1 tablet (600 mg total) by mouth every 6 (six) hours as needed (mild pain). 01/27/12   Thurnell Lose, MD  lisinopril (PRINIVIL,ZESTRIL) 5 MG tablet Take 5 mg by mouth daily.    [provider]  Multiple Vitamins-Minerals (CENTRUM SILVER ADULT 50+ PO) Take 1 tablet by mouth daily.    [provider]  OVER THE COUNTER MEDICATION Take 1 capsule by mouth 3 (three) times a week. Blood Builder- contains Vitamin C, Folate, Vitamin B12, and Iron. Takes on Wed and Sunday.    [provider]  Red Yeast Rice 600 MG CAPS Take 2,400 mg by mouth daily.     [provider]  sertraline (ZOLOFT) 100 MG tablet Take 100 mg by mouth daily. 06/30/16   [provider]  vitamin B-12 (CYANOCOBALAMIN) 1000 MCG tablet Take 1,000 mcg by mouth 4 (four) times a week.     [provider]    Family History No family history on file.  Social History Social History   Tobacco Use  . Smoking status: Never Smoker  . Smokeless tobacco: Never Used  Substance Use Topics  . Alcohol use: No  . Drug use: No     Allergies   Codeine and Penicillins   Review of Systems Review of Systems  Constitutional: Negative for fever.  Cardiovascular: Positive for syncope.  Skin: Positive for wound.  All other systems reviewed and are negative.    Physical Exam Updated Vital Signs BP 127/68   Pulse 89   Temp 98 F (36.7 C) (Oral)   Resp 16   Ht 5\' 1"  (1.549 m)   Wt 61.2 kg   SpO2 98%   BMI 25.51 kg/m   Physical Exam  Constitutional: She is oriented to person, place, and time. She appears well-developed and well-nourished.  HENT:  Head: Normocephalic.  Swollen nose, no obvious deformity, septal hematoma  Eyes: Conjunctivae and EOM are normal.  Neck: Normal range of motion.  Cardiovascular: Normal rate and regular rhythm.  Pulmonary/Chest: No stridor. No respiratory distress.  Abdominal: Soft. She exhibits no distension.    Musculoskeletal: Normal range of motion. She exhibits no edema or deformity.  Neurological: She is alert and oriented to person, place, and time. No cranial nerve deficit.  Skin: Skin is warm and dry.  Abrasion over forehead, nose and right wrist  Nursing note and vitals reviewed.    ED Treatments / Results  Labs (all labs ordered are listed, but only abnormal results are displayed) Labs Reviewed  BASIC METABOLIC PANEL - Abnormal; Notable for the following components:      Result Value   Sodium 132 (*)    Potassium 3.3 (*)    Glucose, Bld 138 (*)    All other components within normal limits  CBC WITH DIFFERENTIAL/PLATELET - Abnormal; Notable for the following components:   WBC 11.9 (*)    Neutro Abs 9.2 (*)    All other components within normal limits  CBG MONITORING, ED - Abnormal; Notable for the following components:   Glucose-Capillary 116 (*)    All other components within normal limits  CBG MONITORING, ED    EKG None  Radiology Ct Head Wo Contrast  Result Date: 12/29/2017 CLINICAL DATA:  [place] while walking the dog and struck her face, laceration RIGHT supraorbital, does not remember fall EXAM: CT HEAD WITHOUT CONTRAST CT MAXILLOFACIAL WITHOUT CONTRAST CT CERVICAL SPINE WITHOUT CONTRAST TECHNIQUE: Multidetector CT imaging of the head, cervical spine, and maxillofacial structures were performed using the standard protocol without intravenous contrast. Multiplanar CT image reconstructions of the cervical spine and maxillofacial structures were also generated. Right side of face marked with BB. COMPARISON:  None FINDINGS: CT HEAD FINDINGS Brain: Generalized atrophy. Normal ventricular morphology. No midline shift or mass effect. Otherwise normal appearance of brain parenchyma. No intracranial hemorrhage, mass lesion or evidence of acute infarction. No extra-axial fluid collections. Vascular: Atherosclerotic calcification of internal carotid arteries bilaterally at skull  base Skull: Calvaria intact Other: N/A CT MAXILLOFACIAL FINDINGS Osseous: Scattered beam hardening artifacts of dental origin. Minimal nasal septal deviation to the LEFT. Minimally displaced fracture at tip of distal LEFT nasal bone. Orbits, sinuses and zygomas intact. TMJ alignment normal bilaterally. No additional facial bone fractures. Orbits: Intraorbital soft tissue planes clear. No orbital fractures identified. Sinuses: Paranasal sinuses, mastoid air cells, and middle ear cavities clear bilaterally Soft tissues: Soft tissue swelling at nose. Tiny subcutaneous radiopaque foreign body at the anterior nose. Minimal RIGHT supraorbital scalp soft tissue swelling. CT CERVICAL SPINE FINDINGS Alignment: Minimal anterolisthesis at C4-C5 likely due to degenerative disc and facet disease. Remaining alignments normal. Skull base and vertebrae: Visualized skull base intact. Osseous mineralization normal. Vertebral body heights maintained. Multilevel facet degenerative changes. No fracture, additional subluxation or bone destruction. Soft tissues and spinal canal: Prevertebral soft tissues normal thickness. Cervical soft tissues otherwise unremarkable.  Disc levels: Disc space narrowing C5-C6 through T1-T2. Minimal discogenic sclerosis at C6-C7 and T1-T2. Upper chest: Lung apices clear Other: N/A IMPRESSION: Generalized atrophy. No acute intracranial abnormalities. Tiny displaced fracture at tip of distal LEFT nasal bone. Tiny radiopaque foreign body within the soft tissues at the anterior nose. Degenerative disc and facet disease changes of the cervical spine. No acute cervical spine abnormalities. Electronically Signed   By: Lavonia Dana M.D.   On: 12/29/2017 20:41   Ct Cervical Spine Wo Contrast  Result Date: 12/29/2017 CLINICAL DATA:  [place] while walking the dog and struck her face, laceration RIGHT supraorbital, does not remember fall EXAM: CT HEAD WITHOUT CONTRAST CT MAXILLOFACIAL WITHOUT CONTRAST CT  CERVICAL SPINE WITHOUT CONTRAST TECHNIQUE: Multidetector CT imaging of the head, cervical spine, and maxillofacial structures were performed using the standard protocol without intravenous contrast. Multiplanar CT image reconstructions of the cervical spine and maxillofacial structures were also generated. Right side of face marked with BB. COMPARISON:  None FINDINGS: CT HEAD FINDINGS Brain: Generalized atrophy. Normal ventricular morphology. No midline shift or mass effect. Otherwise normal appearance of brain parenchyma. No intracranial hemorrhage, mass lesion or evidence of acute infarction. No extra-axial fluid collections. Vascular: Atherosclerotic calcification of internal carotid arteries bilaterally at skull base Skull: Calvaria intact Other: N/A CT MAXILLOFACIAL FINDINGS Osseous: Scattered beam hardening artifacts of dental origin. Minimal nasal septal deviation to the LEFT. Minimally displaced fracture at tip of distal LEFT nasal bone. Orbits, sinuses and zygomas intact. TMJ alignment normal bilaterally. No additional facial bone fractures. Orbits: Intraorbital soft tissue planes clear. No orbital fractures identified. Sinuses: Paranasal sinuses, mastoid air cells, and middle ear cavities clear bilaterally Soft tissues: Soft tissue swelling at nose. Tiny subcutaneous radiopaque foreign body at the anterior nose. Minimal RIGHT supraorbital scalp soft tissue swelling. CT CERVICAL SPINE FINDINGS Alignment: Minimal anterolisthesis at C4-C5 likely due to degenerative disc and facet disease. Remaining alignments normal. Skull base and vertebrae: Visualized skull base intact. Osseous mineralization normal. Vertebral body heights maintained. Multilevel facet degenerative changes. No fracture, additional subluxation or bone destruction. Soft tissues and spinal canal: Prevertebral soft tissues normal thickness. Cervical soft tissues otherwise unremarkable. Disc levels: Disc space narrowing C5-C6 through T1-T2.  Minimal discogenic sclerosis at C6-C7 and T1-T2. Upper chest: Lung apices clear Other: N/A IMPRESSION: Generalized atrophy. No acute intracranial abnormalities. Tiny displaced fracture at tip of distal LEFT nasal bone. Tiny radiopaque foreign body within the soft tissues at the anterior nose. Degenerative disc and facet disease changes of the cervical spine. No acute cervical spine abnormalities. Electronically Signed   By: Lavonia Dana M.D.   On: 12/29/2017 20:41   Ct Maxillofacial Wo Contrast  Result Date: 12/29/2017 CLINICAL DATA:  [place] while walking the dog and struck her face, laceration RIGHT supraorbital, does not remember fall EXAM: CT HEAD WITHOUT CONTRAST CT MAXILLOFACIAL WITHOUT CONTRAST CT CERVICAL SPINE WITHOUT CONTRAST TECHNIQUE: Multidetector CT imaging of the head, cervical spine, and maxillofacial structures were performed using the standard protocol without intravenous contrast. Multiplanar CT image reconstructions of the cervical spine and maxillofacial structures were also generated. Right side of face marked with BB. COMPARISON:  None FINDINGS: CT HEAD FINDINGS Brain: Generalized atrophy. Normal ventricular morphology. No midline shift or mass effect. Otherwise normal appearance of brain parenchyma. No intracranial hemorrhage, mass lesion or evidence of acute infarction. No extra-axial fluid collections. Vascular: Atherosclerotic calcification of internal carotid arteries bilaterally at skull base Skull: Calvaria intact Other: N/A CT MAXILLOFACIAL FINDINGS Osseous: Scattered  beam hardening artifacts of dental origin. Minimal nasal septal deviation to the LEFT. Minimally displaced fracture at tip of distal LEFT nasal bone. Orbits, sinuses and zygomas intact. TMJ alignment normal bilaterally. No additional facial bone fractures. Orbits: Intraorbital soft tissue planes clear. No orbital fractures identified. Sinuses: Paranasal sinuses, mastoid air cells, and middle ear cavities clear  bilaterally Soft tissues: Soft tissue swelling at nose. Tiny subcutaneous radiopaque foreign body at the anterior nose. Minimal RIGHT supraorbital scalp soft tissue swelling. CT CERVICAL SPINE FINDINGS Alignment: Minimal anterolisthesis at C4-C5 likely due to degenerative disc and facet disease. Remaining alignments normal. Skull base and vertebrae: Visualized skull base intact. Osseous mineralization normal. Vertebral body heights maintained. Multilevel facet degenerative changes. No fracture, additional subluxation or bone destruction. Soft tissues and spinal canal: Prevertebral soft tissues normal thickness. Cervical soft tissues otherwise unremarkable. Disc levels: Disc space narrowing C5-C6 through T1-T2. Minimal discogenic sclerosis at C6-C7 and T1-T2. Upper chest: Lung apices clear Other: N/A IMPRESSION: Generalized atrophy. No acute intracranial abnormalities. Tiny displaced fracture at tip of distal LEFT nasal bone. Tiny radiopaque foreign body within the soft tissues at the anterior nose. Degenerative disc and facet disease changes of the cervical spine. No acute cervical spine abnormalities. Electronically Signed   By: Lavonia Dana M.D.   On: 12/29/2017 20:41    Procedures Procedures (including critical care time)  Medications Ordered in ED Medications  potassium chloride SA (K-DUR,KLOR-CON) CR tablet 40 mEq (40 mEq Oral Given 12/29/17 2300)  magnesium sulfate IVPB 2 g 50 mL (0 g Intravenous Stopped 12/30/17 0000)     Initial Impression / Assessment and Plan / ED Course  I have reviewed the triage vital signs and the nursing notes.  Pertinent labs & imaging results that were available during my care of the patient were reviewed by me and considered in my medical decision making (see chart for details).   77 year old female who had episode of syncope today.  Patient states that she was walking [place] and took up around a bunch bystanders.  They did not report  any seizure-like activity.  EMS was called brought her here and reportedly had normal vital signs and blood sugar.  Slightly hypertensive.  States this happened one time in the past.  She did not have any preceding chest pain, headache, vision changes, back pain or abdominal pain.  Low suspicion for any cardiac cause for this or neurologic cause this point.  Suspect that this could be anterograde amnesia rather than actual syncopal event however we will follow-up with her primary doctor to get further work-up for the same. Small nasal fracture, not requiring repair. Will dc to fu w/ pcp for same.   Final Clinical Impressions(s) / ED Diagnoses   Final diagnoses:  Syncope and collapse  Closed fracture of nasal bone, initial encounter  Abrasions of multiple sites    ED Discharge Orders    None       Danell Verno, Corene Cornea, MD 12/30/17 0004

## 2017-12-30 NOTE — ED Notes (Signed)
Patient verbalizes understanding of discharge instructions. Opportunity for questioning and answers were provided. Armband removed by staff, pt discharged from ED home via POV.  

## 2018-01-05 ENCOUNTER — Other Ambulatory Visit (HOSPITAL_COMMUNITY): Payer: Self-pay | Admitting: Family Medicine

## 2018-01-05 DIAGNOSIS — R55 Syncope and collapse: Secondary | ICD-10-CM

## 2018-01-08 ENCOUNTER — Other Ambulatory Visit: Payer: Self-pay

## 2018-01-08 ENCOUNTER — Ambulatory Visit (HOSPITAL_COMMUNITY): Payer: Medicare Other | Attending: Family Medicine

## 2018-01-08 DIAGNOSIS — R55 Syncope and collapse: Secondary | ICD-10-CM | POA: Insufficient documentation

## 2018-02-03 NOTE — Progress Notes (Signed)
Cardiology Office Note   Date:  02/04/2018   ID:  Lisa Morton, DOB [birth year], MRN [id number]  PCP:  Lisa Small, MD  Cardiologist:   No primary care provider on file.   Chief Complaint  Patient presents with  . Loss of Consciousness      History of Present Illness: Lisa Morton is a 77 y.o. female who is referred by Lisa Small, MD for evaluation of syncope.  She was in the ED last month with syncope.  I reviewed these records for this visit.   There was no clear etiology identified.  Echo in follow up demonstrated NL LV function.  There was mild AI.   She has had no significant plaque on carotid Doppler last year.   She did have a negative neurology work up and I did review these records.  MRI was negative .  She had EMGs and apparently EEGs.  She reports she is had 2 episodes.  One was about a year and half ago.  The second one was October 15.  That is when she presented to the emergency room.  She said they were both exactly the same.   She says that she was out walking the dog.  She felt like she had an out of body experience.  She felt like her mind and her body went sink.  She started staggering.  She could not control things.  She said she did not see things spinning.  She did not feel her heart racing.  She said she actually went down and the next thing she knows EMS was looking at her.  Per the ER notes she was up for about a minute and a half.  Other than this episode in the one before she has not had any presyncope or syncope.  She does not describe orthostatic symptoms.  She does not have any chest pressure, neck or arm discomfort.  She has no shortness of breath, PND or orthopnea.  She has no weight gain or edema.   Past Medical History:  Diagnosis Date  . Depression   . Hyperlipemia   . Hypertension   . Restless leg syndrome    on klonidipin    Past Surgical History:  Procedure Laterality Date  . ABDOMINAL HYSTERECTOMY  1984  . CATARACT EXTRACTION  07/2011    . CESAREAN SECTION    . CYSTOCELE REPAIR  01/26/2012   Procedure: ANTERIOR REPAIR (CYSTOCELE);  Surgeon: Thurnell Lose, MD;  Location: [facility];  Service: Gynecology;  Laterality: N/A;     Current Outpatient Medications  Medication Sig Dispense Refill  . aspirin EC 81 MG tablet Take 81 mg by mouth daily.    . Calcium-Magnesium-Zinc (CAL-MAG-ZINC PO) Take by mouth.    . cholecalciferol (VITAMIN D) 1000 UNITS tablet Take 1,000 Units by mouth daily.    . clonazePAM (KLONOPIN) 1 MG tablet Take 1 mg by mouth at bedtime.     . fish oil-omega-3 fatty acids 1000 MG capsule Take 2 g by mouth daily.    . hydrochlorothiazide (HYDRODIURIL) 25 MG tablet Take 12.5 mg by mouth daily.    Marland Kitchen ibuprofen (ADVIL,MOTRIN) 600 MG tablet Take 1 tablet (600 mg total) by mouth every 6 (six) hours as needed (mild pain). 30 tablet 1  . lisinopril (PRINIVIL,ZESTRIL) 5 MG tablet Take 5 mg by mouth daily.    . meloxicam (MOBIC) 15 MG tablet Take 1 tablet by mouth daily.    . Multiple Vitamins-Minerals (CENTRUM SILVER ADULT 50+  PO) Take 1 tablet by mouth daily.    Marland Kitchen OVER THE COUNTER MEDICATION Take 1 capsule by mouth 3 (three) times a week. Blood Builder- contains Vitamin C, Folate, Vitamin B12, and Iron. Takes on Wed and Sunday.    . Red Yeast Rice 600 MG CAPS Take 2,400 mg by mouth daily.     . sertraline (ZOLOFT) 100 MG tablet Take 100 mg by mouth daily.    . vitamin B-12 (CYANOCOBALAMIN) 1000 MCG tablet Take 1,000 mcg by mouth 4 (four) times a week.      No current facility-administered medications for this visit.     Allergies:   Codeine and Penicillins    Social History:  The patient  reports that she has never smoked. She has never used smokeless tobacco. She reports that she does not drink alcohol or use drugs.   Family History:  The patient's family history includes Bipolar disorder in her sister; Breast cancer in her maternal grandmother; Cancer in her mother; Heart attack in her father.    ROS:  Please  see the history of present illness.   Otherwise, review of systems are positive for none.   All other systems are reviewed and negative.    PHYSICAL EXAM: VS:  BP (!) 148/68   Pulse 79   Ht 5\' 1"  (1.549 m)   Wt 134 lb (60.8 kg)   BMI 25.32 kg/m  , BMI Body mass index is 25.32 kg/m. GENERAL:  Well appearing HEENT:  Pupils equal round and reactive, fundi not visualized, oral mucosa unremarkable NECK:  No jugular venous distention, waveform within normal limits, carotid upstroke brisk and symmetric, no bruits, no thyromegaly LYMPHATICS:  No cervical, inguinal adenopathy LUNGS:  Clear to auscultation bilaterally BACK:  No CVA tenderness CHEST:  Unremarkable HEART:  PMI not displaced or sustained,S1 and S2 within normal limits, no S3, no S4, no clicks, no rubs, no murmurs ABD:  Flat, positive bowel sounds normal in frequency in pitch, no bruits, no rebound, no guarding, no midline pulsatile mass, no hepatomegaly, no splenomegaly EXT:  2 plus pulses throughout, no edema, no cyanosis no clubbing SKIN:  No rashes no nodules NEURO:  Cranial nerves II through XII grossly intact, motor grossly intact throughout PSYCH:  Cognitively intact, oriented to person place and time    EKG:  EKG is ordered today. The ekg ordered today demonstrates sinus rhythm, rate 79, axis within normal limits, intervals within normal limits, no acute ST-T wave changes.   Recent Labs: 12/29/2017: BUN 18; Creatinine, Ser 0.69; Hemoglobin 12.7; Platelets 260; Potassium 3.3; Sodium 132    Lipid Panel No results found for: CHOL, TRIG, HDL, CHOLHDL, VLDL, LDLCALC, LDLDIRECT    Wt Readings from Last 3 Encounters:  02/04/18 134 lb (60.8 kg)  12/29/17 135 lb (61.2 kg)  07/10/16 125 lb (56.7 kg)      Other studies Reviewed: Additional studies/ records that were reviewed today include: None. Review of the above records demonstrates:  Please see elsewhere in the note.     ASSESSMENT AND PLAN:  SYNCOPE:  The  patient had syncope twice with trauma over a year and a half.  I do not see any obvious markers of a cardiovascular etiology.  However, she is had an extensive negative neuro work-up.  I will plan to start with a 4-week event monitor. Kameelah Minish will need a 21 day event monitor.  The patients symptoms necessitate an event monitor.  The symptoms are too infrequent to be identified on  a Holter monitor.   She might need a loop monitor given the severity of her symptoms.  HTN: Blood pressures well controlled.  She can continue the meds as listed.  There were no orthostatic blood pressure changes or positional tachycardia.   Current medicines are reviewed at length with the patient today.  The patient does not have concerns regarding medicines.  The following changes have been made:  no change  Labs/ tests ordered today include:  Orders Placed This Encounter  Procedures  . CARDIAC EVENT MONITOR  . EKG 12-Lead     Disposition:   FU with me after the monitor.     Signed, Minus Breeding, MD  02/04/2018 3:24 PM    [facility]

## 2018-02-04 ENCOUNTER — Ambulatory Visit: Payer: Medicare Other | Admitting: Cardiology

## 2018-02-04 ENCOUNTER — Encounter: Payer: Self-pay | Admitting: Cardiology

## 2018-02-04 VITALS — BP 148/68 | HR 79 | Ht 61.0 in | Wt 134.0 lb

## 2018-02-04 DIAGNOSIS — R55 Syncope and collapse: Secondary | ICD-10-CM | POA: Diagnosis not present

## 2018-02-04 NOTE — Patient Instructions (Signed)
Medication Instructions:  Continue current medications  If you need a refill on your cardiac medications before your next appointment, please call your pharmacy.  Labwork: None Ordered   If you have labs (blood work) drawn today and your tests are completely normal, you will receive your results only by: Marland Kitchen MyChart Message (if you have MyChart) OR . A paper copy in the mail If you have any lab test that is abnormal or we need to change your treatment, we will call you to review the results.  Testing/Procedures: Your physician has recommended that you wear an event monitor for 30 days. Event monitors are medical devices that record the heart's electrical activity. Doctors most often [place] these monitors to diagnose arrhythmias. Arrhythmias are problems with the speed or rhythm of the heartbeat. The monitor is a small, portable device. You can wear one while you do your normal daily activities. This is usually used to diagnose what is causing palpitations/syncope (passing out).  Follow-Up: . You will need a follow up appointment in 6 Weeks.    At [facility], you and your health needs are our priority.  As part of our continuing mission to provide you with exceptional heart care, we have created designated Provider Care Teams.  These Care Teams include your primary Cardiologist (physician) and Advanced Practice Providers (APPs -  Physician Assistants and Nurse Practitioners) who all work together to provide you with the care you need, when you need it.   Thank you for choosing [facility] at [facility]!!

## 2018-02-16 ENCOUNTER — Ambulatory Visit (INDEPENDENT_AMBULATORY_CARE_PROVIDER_SITE_OTHER): Payer: Medicare Other

## 2018-02-16 DIAGNOSIS — R55 Syncope and collapse: Secondary | ICD-10-CM

## 2018-03-25 ENCOUNTER — Ambulatory Visit: Payer: Medicare Other | Admitting: Cardiology

## 2018-03-29 NOTE — Progress Notes (Signed)
Cardiology Office Note   Date:  03/31/2018   ID:  Lisa Morton, DOB Jan 26, 1941, MRN [id number]  PCP:  Maurice Small, MD  Cardiologist:   No primary care provider on file.   Chief Complaint  Patient presents with  . Loss of Consciousness      History of Present Illness: Lisa Morton is a 78 y.o. female who is referred by Maurice Small, MD for evaluation of syncope.  She was in the ED late last year with syncope.There was no clear etiology identified.  Echo in follow up demonstrated NL LV function.  There was mild AI.   She has had no significant plaque on carotid Doppler last year.   She did have a negative neurology work up.   MRI was negative .  She had EMGs and apparently EEGs.  I had her wear an event monitor and this demonstrated no arrhythmia.    She has had no further episodes as described in the previous note.  These were frank syncope.  However, she has not been exercising which is walking her dog.  She is been too afraid given the previous events.  She states she does not have these episodes with change in position.  They do not happen unprovoked.  Is very disappointed that she feels uncomfortable getting back to her level of activity.  She has not had any new palpitations.  She has not had any new shortness of breath, PND or orthopnea.  No weight gain or edema.   Past Medical History:  Diagnosis Date  . Depression   . Hyperlipemia   . Hypertension   . Restless leg syndrome    on klonidipin    Past Surgical History:  Procedure Laterality Date  . ABDOMINAL HYSTERECTOMY  1984  . CATARACT EXTRACTION  07/2011  . CESAREAN SECTION    . CYSTOCELE REPAIR  01/26/2012   Procedure: ANTERIOR REPAIR (CYSTOCELE);  Surgeon: Thurnell Lose, MD;  Location: [facility];  Service: Gynecology;  Laterality: N/A;     Current Outpatient Medications  Medication Sig Dispense Refill  . Calcium-Magnesium-Zinc (CAL-MAG-ZINC PO) Take by mouth.    . cholecalciferol (VITAMIN D) 1000 UNITS tablet  Take 1,000 Units by mouth daily.    . clonazePAM (KLONOPIN) 1 MG tablet Take 1 mg by mouth at bedtime.     . fish oil-omega-3 fatty acids 1000 MG capsule Take 2 g by mouth daily.    . hydrochlorothiazide (HYDRODIURIL) 25 MG tablet Take 12.5 mg by mouth daily.    Marland Kitchen lisinopril (PRINIVIL,ZESTRIL) 5 MG tablet Take 5 mg by mouth daily.    . meloxicam (MOBIC) 15 MG tablet Take 1 tablet by mouth daily.    . Multiple Vitamins-Minerals (CENTRUM SILVER ADULT 50+ PO) Take 1 tablet by mouth daily.    Marland Kitchen OVER THE COUNTER MEDICATION Take 1 capsule by mouth 3 (three) times a week. Blood Builder- contains Vitamin C, Folate, Vitamin B12, and Iron. Takes on Wed and Sunday.    . Red Yeast Rice 600 MG CAPS Take 2,400 mg by mouth daily.     . sertraline (ZOLOFT) 100 MG tablet Take 100 mg by mouth daily.    . vitamin B-12 (CYANOCOBALAMIN) 1000 MCG tablet Take 1,000 mcg by mouth 4 (four) times a week.     Marland Kitchen aspirin EC 81 MG tablet Take 81 mg by mouth daily.     No current facility-administered medications for this visit.     Allergies:   Codeine and Penicillins  ROS:  Please see the history of present illness.   Otherwise, review of systems are positive for back and joint pain.   All other systems are reviewed and negative.    PHYSICAL EXAM: VS:  BP (!) 102/58   Pulse 89   Ht 5\' 1"  (1.549 m)   Wt 134 lb 12.8 oz (61.1 kg)   SpO2 98%   BMI 25.47 kg/m  , BMI Body mass index is 25.47 kg/m. GENERAL:  Well appearing NECK:  No jugular venous distention, waveform within normal limits, carotid upstroke brisk and symmetric, no bruits, no thyromegaly LUNGS:  Clear to auscultation bilaterally CHEST:  Unremarkable HEART:  PMI not displaced or sustained,S1 and S2 within normal limits, no S3, no S4, no clicks, no rubs, no murmurs ABD:  Flat, positive bowel sounds normal in frequency in pitch, no bruits, no rebound, no guarding, no midline pulsatile mass, no hepatomegaly, no splenomegaly EXT:  2 plus pulses  throughout, no edema, no cyanosis no clubbing   EKG:  EKG is not ordered today.   Recent Labs: 12/29/2017: BUN 18; Creatinine, Ser 0.69; Hemoglobin 12.7; Platelets 260; Potassium 3.3; Sodium 132    Lipid Panel No results found for: CHOL, TRIG, HDL, CHOLHDL, VLDL, LDLCALC, LDLDIRECT    Wt Readings from Last 3 Encounters:  03/31/18 134 lb 12.8 oz (61.1 kg)  02/04/18 134 lb (60.8 kg)  12/29/17 135 lb (61.2 kg)      Other studies Reviewed: Additional studies/ records that were reviewed today include: [place]. Review of the above records demonstrates:     ASSESSMENT AND PLAN:  SYNCOPE:   The etiology of this is still not clear.  It seems to have been provoked by walking.  I am going to want to reproduce that level of exertion POET (Plain Old Exercise Treadmill).  I would suggest this be a modified Bruce protocol.  If this does not reproduce the event which we did not capture on the monitor she will need an implantable event monitor.   HTN:    Blood pressures is at target.  No change in therapy.  She was not orthostatic at the last visit.  W   Current medicines are reviewed at length with the patient today.  The patient does not have concerns regarding medicines.  The following changes have been made:  None  Labs/ tests ordered today include:      Orders Placed This Encounter  Procedures  . EXERCISE TOLERANCE TEST (ETT)     Disposition:   FU with me after the POET (Plain Old Exercise Treadmill) and monitor.    Signed, Minus Breeding, MD  03/31/2018 11:11 AM    [place]

## 2018-03-31 ENCOUNTER — Encounter: Payer: Self-pay | Admitting: Cardiology

## 2018-03-31 ENCOUNTER — Ambulatory Visit: Payer: Medicare Other | Admitting: Cardiology

## 2018-03-31 VITALS — BP 102/58 | HR 89 | Ht 61.0 in | Wt 134.8 lb

## 2018-03-31 DIAGNOSIS — R55 Syncope and collapse: Secondary | ICD-10-CM

## 2018-03-31 DIAGNOSIS — I10 Essential (primary) hypertension: Secondary | ICD-10-CM

## 2018-03-31 NOTE — Patient Instructions (Signed)
Medication Instructions:  Continue current medications  If you need a refill on your cardiac medications before your next appointment, please call your pharmacy.  Labwork: None Ordered   Take the provided lab slips with you to the lab for your blood draw.  When you have your labs (blood work) drawn today and your tests are completely normal, you will receive your results only by MyChart Message (if you have MyChart) -OR-  A paper copy in the mail.  If you have any lab test that is abnormal or we need to change your treatment, we will call you to review these results.  Testing/Procedures: Your physician has requested that you have an exercise tolerance test. For further information please visit [email or web address]. Please also follow instruction sheet, as given.  Follow-Up: You will need a follow up appointment in 3 months.  Please call our office 2 months in advance to schedule this appointment.  You may see Dr Percival Spanish or one of the following Advanced Practice Providers on your designated Care Team:   Rosaria Ferries, PA-C . Jory Sims, DNP, ANP   At [facility], you and your health needs are our priority.  As part of our continuing mission to provide you with exceptional heart care, we have created designated Provider Care Teams.  These Care Teams include your primary Cardiologist (physician) and Advanced Practice Providers (APPs -  Physician Assistants and Nurse Practitioners) who all work together to provide you with the care you need, when you need it.  Thank you for choosing [facility] at [facility]!!

## 2018-04-09 ENCOUNTER — Telehealth (HOSPITAL_COMMUNITY): Payer: Self-pay

## 2018-04-09 NOTE — Telephone Encounter (Signed)
Encounter complete. 

## 2018-04-14 ENCOUNTER — Ambulatory Visit (HOSPITAL_COMMUNITY)
Admission: RE | Admit: 2018-04-14 | Discharge: 2018-04-14 | Disposition: A | Discharge status: Home / Self Care | Payer: Medicare Other | Source: Ambulatory Visit | Attending: Cardiovascular Disease | Admitting: Cardiovascular Disease

## 2018-04-14 DIAGNOSIS — R55 Syncope and collapse: Secondary | ICD-10-CM | POA: Diagnosis not present

## 2018-04-14 LAB — EXERCISE TOLERANCE TEST
CHL CUP RESTING HR STRESS: 76 {beats}/min
CHL RATE OF PERCEIVED EXERTION: 18
CSEPED: 9 min
CSEPEDS: 0 s
CSEPEW: 4.6 METS
CSEPHR: 107 %
MPHR: 141 {beats}/min
Peak HR: 151 {beats}/min

## 2018-04-23 ENCOUNTER — Telehealth: Payer: Self-pay | Admitting: Cardiology

## 2018-04-23 NOTE — Telephone Encounter (Signed)
Pt aware of stress results per Dr Percival Spanish not a high risk study and will discuss findings at next office visit The patient instructed to call if has any other S/S. Pt verbalizes understanding .Adonis Housekeeper

## 2018-04-23 NOTE — Telephone Encounter (Signed)
  Patient is calling because she would like the results of her stress test

## 2018-05-03 NOTE — Progress Notes (Signed)
Cardiology Office Note   Date:  05/05/2018   ID:  Lisa Morton, DOB 1940-11-26, MRN [id number]  PCP:  Maurice Small, MD  Cardiologist:   No primary care provider on file.   Chief Complaint  Patient presents with  . Loss of Consciousness      History of Present Illness: Lisa Morton is a 78 y.o. female who is referred by Maurice Small, MD for evaluation of syncope.  She was in the ED late last year with syncope.There was no clear etiology identified.  Echo in follow up demonstrated NL LV function.  There was mild AI.   She has had no significant plaque on carotid Doppler last year.   She did have a negative neurology work up.   MRI was negative .  She had EMGs and apparently EEGs.  I had her wear an event monitor and this demonstrated no arrhythmia.  She had a POET (Plain Old Exercise Treadmill) with no abnormalities.  She did have some mild ST changes.  However, I personally reviewed these tracings and there was a low risk study.   She was able to go 9 minutes and did not have syncope or presyncope.   This was modified Bruce.  Since I have been seeing her she has not had any further events.  Her events were April 2018 in October 2019.  She has not been back out walking which is when she is had these events.  She is had no presyncope or syncope.  She is not having any palpitations.  She denies any chest pressure, neck or arm discomfort.  She is had no new shortness of breath, PND or orthopnea.  She is very frightened of having another event.   Past Medical History:  Diagnosis Date  . Depression   . Hyperlipemia   . Hypertension   . Restless leg syndrome    on klonidipin    Past Surgical History:  Procedure Laterality Date  . ABDOMINAL HYSTERECTOMY  1984  . CATARACT EXTRACTION  07/2011  . CESAREAN SECTION    . CYSTOCELE REPAIR  01/26/2012   Procedure: ANTERIOR REPAIR (CYSTOCELE);  Surgeon: Thurnell Lose, MD;  Location: [facility];  Service: Gynecology;  Laterality: N/A;      Current Outpatient Medications  Medication Sig Dispense Refill  . Calcium-Magnesium-Zinc (CAL-MAG-ZINC PO) Take by mouth.    . cholecalciferol (VITAMIN D) 1000 UNITS tablet Take 1,000 Units by mouth daily.    . clonazePAM (KLONOPIN) 1 MG tablet Take 1 mg by mouth at bedtime.     . fish oil-omega-3 fatty acids 1000 MG capsule Take 2 g by mouth daily.    . hydrochlorothiazide (HYDRODIURIL) 25 MG tablet Take 12.5 mg by mouth daily.    Marland Kitchen lisinopril (PRINIVIL,ZESTRIL) 5 MG tablet Take 5 mg by mouth daily.    . meloxicam (MOBIC) 15 MG tablet Take 1 tablet by mouth daily.    . Multiple Vitamins-Minerals (CENTRUM SILVER ADULT 50+ PO) Take 1 tablet by mouth daily.    Marland Kitchen OVER THE COUNTER MEDICATION Take 1 capsule by mouth 3 (three) times a week. Blood Builder- contains Vitamin C, Folate, Vitamin B12, and Iron. Takes on Wed and Sunday.    . Red Yeast Rice 600 MG CAPS Take 2,400 mg by mouth daily.     . sertraline (ZOLOFT) 100 MG tablet Take 100 mg by mouth daily.    . vitamin B-12 (CYANOCOBALAMIN) 1000 MCG tablet Take 1,000 mcg by mouth 4 (four) times a week.  No current facility-administered medications for this visit.     Allergies:   Codeine and Penicillins    ROS:  Please see the history of present illness.   Otherwise, review of systems are positive for none   All other systems are reviewed and negative.    PHYSICAL EXAM: VS:  BP 115/73   Pulse 82   Ht 5\' 1"  (1.549 m)   Wt 136 lb 9.6 oz (62 kg)   SpO2 99%   BMI 25.81 kg/m  , BMI Body mass index is 25.81 kg/m. GENERAL:  Well appearing NECK:  No jugular venous distention, waveform within normal limits, carotid upstroke brisk and symmetric, no bruits, no thyromegaly LUNGS:  Clear to auscultation bilaterally CHEST:  Unremarkable HEART:  PMI not displaced or sustained,S1 and S2 within normal limits, no S3, no S4, no clicks, no rubs, no murmurs ABD:  Flat, positive bowel sounds normal in frequency in pitch, no bruits, no rebound,  no guarding, no midline pulsatile mass, no hepatomegaly, no splenomegaly EXT:  2 plus pulses throughout, no edema, no cyanosis no clubbing   EKG:  EKG is not ordered today.   Recent Labs: 12/29/2017: BUN 18; Creatinine, Ser 0.69; Hemoglobin 12.7; Platelets 260; Potassium 3.3; Sodium 132    Lipid Panel No results found for: CHOL, TRIG, HDL, CHOLHDL, VLDL, LDLCALC, LDLDIRECT    Wt Readings from Last 3 Encounters:  05/05/18 136 lb 9.6 oz (62 kg)  03/31/18 134 lb 12.8 oz (61.1 kg)  02/04/18 134 lb (60.8 kg)      Other studies Reviewed: Additional studies/ records that were reviewed today include: POET (Plain Old Exercise Treadmill). Review of the above records demonstrates:  See above   ASSESSMENT AND PLAN:  SYNCOPE:   She has had a very extensive work up.  I see no clear etiology.  Despite a high level of exercise on the POET (Plain Old Exercise Treadmill) she did not have recurrent symptoms.  Unfortunately we have not been able to find a clear event.  She wants to defer on having an implanted loop monitor which would be my next suggestion.  I suggest she get back into exercise but always with somebody and I would suggest that she start with Silver Sneakers and she is going to follow-up with this suggestion.  HTN:    Blood pressures well controlled and she will continue the meds as listed.  Of note she has not had orthostasis.   Current medicines are reviewed at length with the patient today.  The patient does not have concerns regarding medicines.  The following changes have been made:  None  Labs/ tests ordered today include:    None  No orders of the defined types were placed in this encounter.    Disposition:   FU with me as needed.     Signed, Minus Breeding, MD  05/05/2018 1:37 PM    [facility]

## 2018-05-05 ENCOUNTER — Encounter: Payer: Self-pay | Admitting: Cardiology

## 2018-05-05 ENCOUNTER — Ambulatory Visit: Payer: Medicare Other | Admitting: Cardiology

## 2018-05-05 VITALS — BP 115/73 | HR 82 | Ht 61.0 in | Wt 136.6 lb

## 2018-05-05 DIAGNOSIS — I10 Essential (primary) hypertension: Secondary | ICD-10-CM

## 2018-05-05 DIAGNOSIS — R55 Syncope and collapse: Secondary | ICD-10-CM | POA: Diagnosis not present

## 2018-05-05 NOTE — Patient Instructions (Signed)

## 2018-06-23 ENCOUNTER — Telehealth: Payer: Self-pay | Admitting: *Deleted

## 2018-06-23 NOTE — Telephone Encounter (Signed)
   Primary Cardiologist:  No primary care provider on file.   Patient contacted.  History reviewed.  No symptoms to suggest any unstable cardiac conditions.  Based on discussion, with current pandemic situation, we will be postponing this appointment for Lisa Morton with a plan for f/u in 3 months or sooner if feasible/necessary.  If symptoms change, she has been instructed to contact our office.   Routing to C19 CANCEL pool for tracking (P CV DIV CV19 CANCEL - reason for visit "other.") and assigning priority (1 = 4-6 wks, 2 = 6-12 wks, 3 = >12 wks).   Barbaraann Barthel  06/23/2018 2:57 PM         .

## 2018-07-01 ENCOUNTER — Ambulatory Visit: Payer: Medicare Other | Admitting: Cardiology

## 2018-07-02 NOTE — Telephone Encounter (Signed)
LVM for patient to call office back to schedule a virtual visit with Dr Percival Spanish.

## 2018-07-06 ENCOUNTER — Telehealth: Payer: Self-pay | Admitting: Cardiology

## 2018-07-06 NOTE — Telephone Encounter (Signed)
Called patient to schedule appointment, but, patient states she will call back if she needs a followup.

## 2018-07-16 ENCOUNTER — Telehealth: Payer: Self-pay | Admitting: Cardiology

## 2018-07-16 NOTE — Telephone Encounter (Signed)
Follow Up:; ° ° °Returning your call. °

## 2019-11-21 IMAGING — CT CT CERVICAL SPINE W/O CM
4 of 10 series · 10 of 33 positions shown, 11 images · non-contrast
Comparison: None

CLINICAL DATA: Fell at home while walking the dog and struck her
face, laceration RIGHT supraorbital, does not remember fall

EXAM:
CT HEAD WITHOUT CONTRAST
CT MAXILLOFACIAL WITHOUT CONTRAST
CT CERVICAL SPINE WITHOUT CONTRAST
TECHNIQUE: Multidetector CT imaging of the head, cervical spine, and
maxillofacial structures were performed using the standard protocol
without intravenous contrast. Multiplanar CT image reconstructions
of the cervical spine and maxillofacial structures were also
generated. Right side of face marked with BB.

[Series 5: head bone · axial · 0.42mm/px · z∈[-79,-21]mm · 2 of 87 slices shown]
[im 29/87  bone]
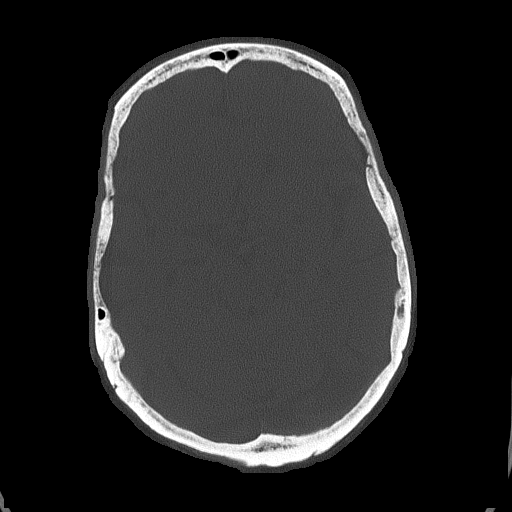
[im 58/87  bone]
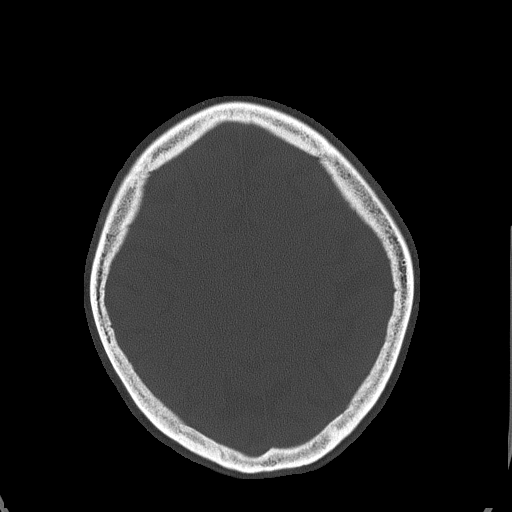

[Series 8: facialbone 2.0 st · axial · 0.35mm/px · z∈[-171,-119]mm · 2 of 78 slices shown, 3 images]
[im 26/78  soft-tissue]
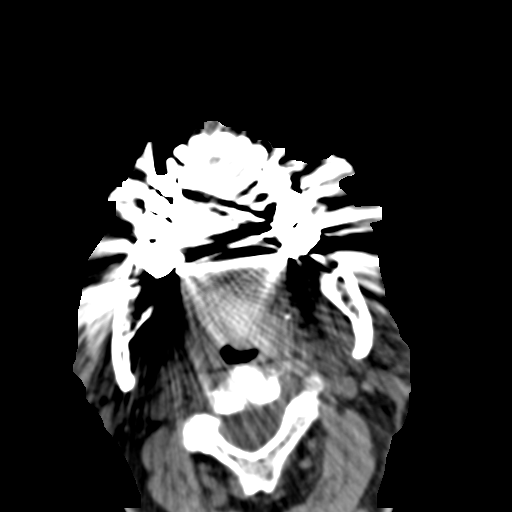
[im 26/78  bone]
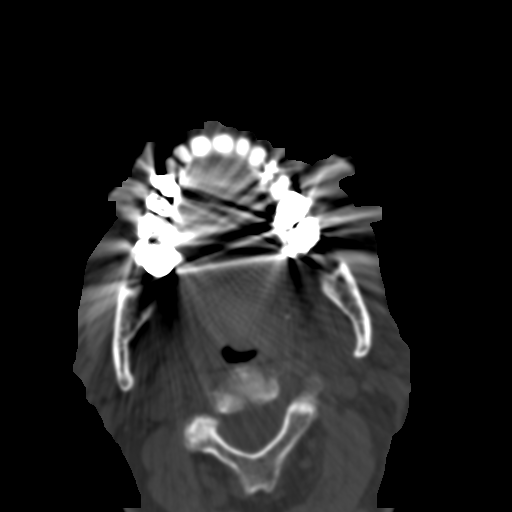
[im 52/78  bone]
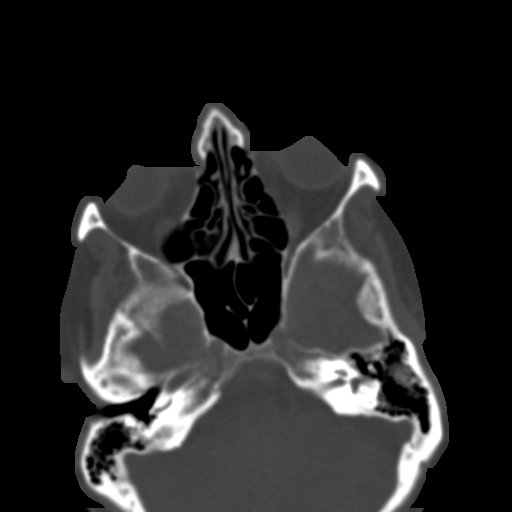

[Series 15: facialbone 2.0 sag st · sagittal · 0.34mm/px · 4 of 96 slices shown]
[im 20/96  bone]
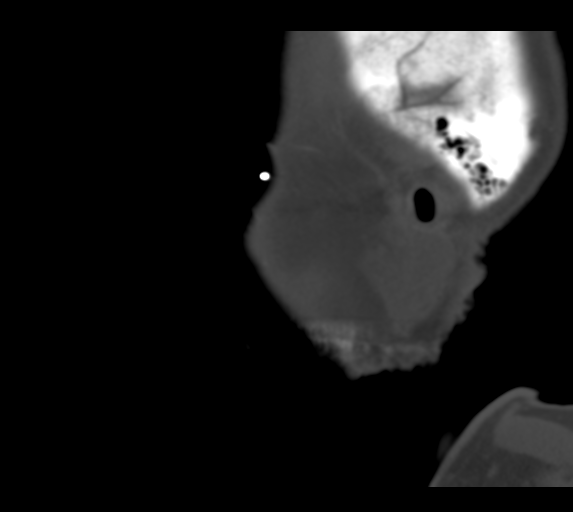
[im 39/96  bone]
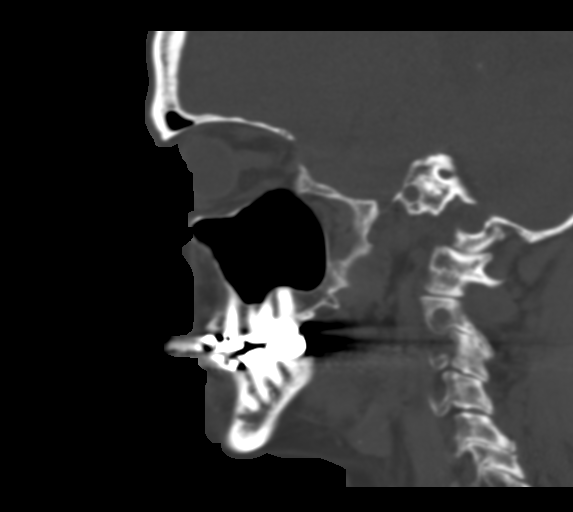
[im 58/96  bone]
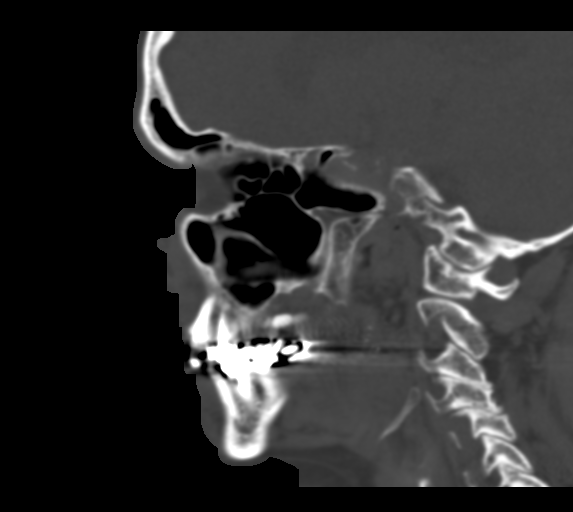
[im 77/96  bone]
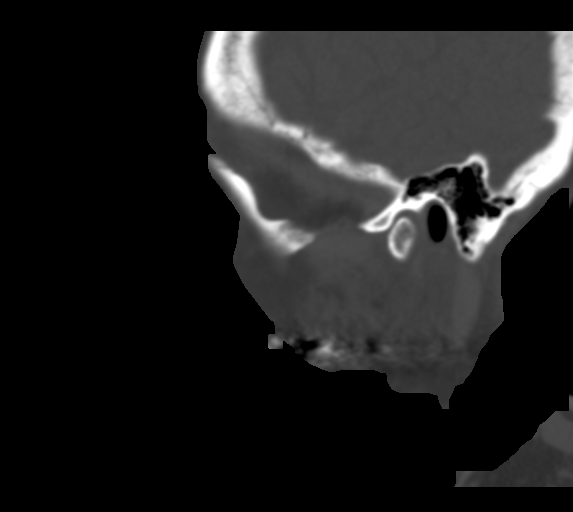

[Series 16: c_spine 2.0 st · axial · 0.29mm/px · z∈[-216,-156]mm · 2 of 90 slices shown]
[im 30/90  bone]
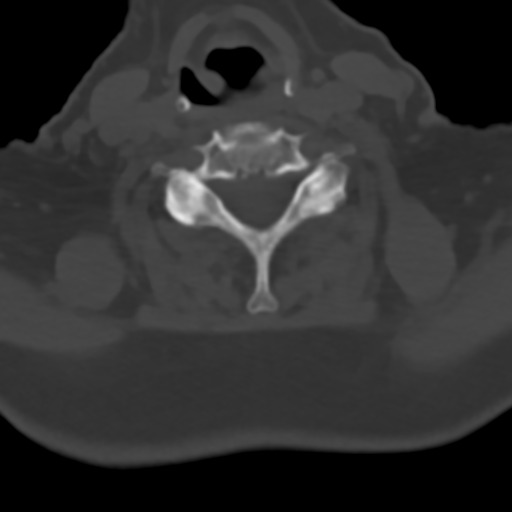
[im 60/90  bone]
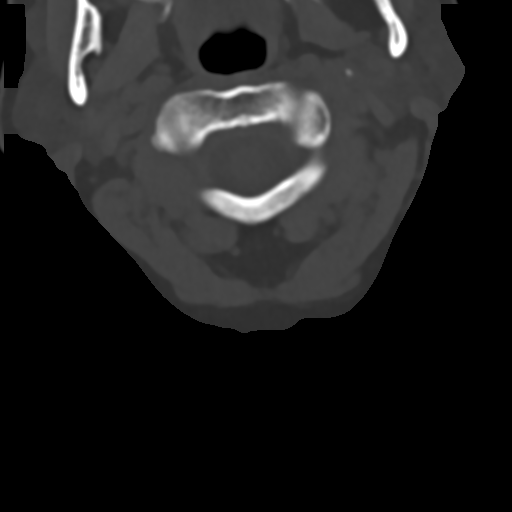

[10 of 33 positions shown; findings below may reference images not displayed]

FINDINGS: CT HEAD FINDINGS

Brain: Generalized atrophy. Normal ventricular morphology. No
midline shift or mass effect. Otherwise normal appearance of brain
parenchyma. No intracranial hemorrhage, mass lesion or evidence of
acute infarction. No extra-axial fluid collections.

Vascular: Atherosclerotic calcification of internal carotid arteries
bilaterally at skull base

Skull: Calvaria intact

Other: N/A

CT MAXILLOFACIAL FINDINGS

Osseous: Scattered beam hardening artifacts of dental origin.
Minimal nasal septal deviation to the LEFT. Minimally displaced
fracture at tip of distal LEFT nasal bone. Orbits, sinuses and
zygomas intact. TMJ alignment normal bilaterally. No additional
facial bone fractures.

Orbits: Intraorbital soft tissue planes clear. No orbital fractures
identified.

Sinuses: Paranasal sinuses, mastoid air cells, and middle ear
cavities clear bilaterally

Soft tissues: Soft tissue swelling at nose. Tiny subcutaneous
radiopaque foreign body at the anterior nose. Minimal RIGHT
supraorbital scalp soft tissue swelling.

CT CERVICAL SPINE FINDINGS

Alignment: Minimal anterolisthesis at C4-C5 likely due to
degenerative disc and facet disease. Remaining alignments normal.

Skull base and vertebrae: Visualized skull base intact. Osseous
mineralization normal. Vertebral body heights maintained. Multilevel
facet degenerative changes. No fracture, additional subluxation or
bone destruction.

Soft tissues and spinal canal: Prevertebral soft tissues normal
thickness. Cervical soft tissues otherwise unremarkable.

Disc levels: Disc space narrowing C5-C6 through T1-T2. Minimal
discogenic sclerosis at C6-C7 and T1-T2.

Upper chest: Lung apices clear

Other: N/A
IMPRESSION: Generalized atrophy.

No acute intracranial abnormalities.

Tiny displaced fracture at tip of distal LEFT nasal bone.

Tiny radiopaque foreign body within the soft tissues at the anterior
nose.

Degenerative disc and facet disease changes of the cervical spine.

No acute cervical spine abnormalities.

## 2019-11-25 ENCOUNTER — Other Ambulatory Visit: Payer: Self-pay | Admitting: Family Medicine

## 2019-11-25 DIAGNOSIS — M7989 Other specified soft tissue disorders: Secondary | ICD-10-CM

## 2019-11-30 ENCOUNTER — Ambulatory Visit
Admission: RE | Admit: 2019-11-30 | Discharge: 2019-11-30 | Disposition: A | Discharge status: Home / Self Care | Payer: Medicare Other | Source: Ambulatory Visit | Attending: Family Medicine | Admitting: Family Medicine

## 2019-11-30 DIAGNOSIS — M7989 Other specified soft tissue disorders: Secondary | ICD-10-CM

## 2020-04-17 DIAGNOSIS — S93402A Sprain of unspecified ligament of left ankle, initial encounter: Secondary | ICD-10-CM | POA: Diagnosis not present

## 2020-04-17 DIAGNOSIS — S63501A Unspecified sprain of right wrist, initial encounter: Secondary | ICD-10-CM | POA: Diagnosis not present

## 2020-04-17 DIAGNOSIS — S42212A Unspecified displaced fracture of surgical neck of left humerus, initial encounter for closed fracture: Secondary | ICD-10-CM | POA: Diagnosis not present

## 2020-04-18 DIAGNOSIS — M25512 Pain in left shoulder: Secondary | ICD-10-CM | POA: Diagnosis not present

## 2020-04-18 DIAGNOSIS — M25531 Pain in right wrist: Secondary | ICD-10-CM | POA: Diagnosis not present

## 2020-05-16 DIAGNOSIS — M25512 Pain in left shoulder: Secondary | ICD-10-CM | POA: Diagnosis not present

## 2020-05-31 ENCOUNTER — Other Ambulatory Visit: Payer: Self-pay

## 2020-05-31 ENCOUNTER — Encounter (HOSPITAL_COMMUNITY): Payer: Self-pay | Admitting: Orthopaedic Surgery

## 2020-05-31 NOTE — Progress Notes (Addendum)
COVID Vaccine Completed: Yes Date COVID Vaccine completed: Yes Has received booster: Yes COVID vaccine manufacturer:    Moderna   Date of COVID positive in last 69 days:N/A  PCP - Maurice Small, MD Cardiologist - Dr. Minus Breeding last office visit note 05/05/18 in epic  Chest x-ray - greater than 1 year in epic EKG - greater than 1 year in epic Stress Test - greater than 2 years in epic ECHO - greater than 2 years in epic Cardiac Cath - N/A Pacemaker/ICD device last checked: N/A  Sleep Study - N/A CPAP - N/A  Fasting Blood Sugar - N/A Checks Blood Sugar __N/A___ times a day  Blood Thinner Instructions: N/A Aspirin Instructions: N/A Last Dose: N/A  Activity level:  Can go up a flight of stairs and activities of daily living without stopping and without symptoms      Anesthesia review: N/A  Patient denies shortness of breath, fever, cough and chest pain at PAT appointment   Patient verbalized understanding of instructions that were given to them at the PAT appointment. Patient was also instructed that they will need to review over the PAT instructions again at home before surgery.

## 2020-06-01 ENCOUNTER — Other Ambulatory Visit (HOSPITAL_COMMUNITY)
Admission: RE | Admit: 2020-06-01 | Discharge: 2020-06-01 | Disposition: A | Discharge status: Home / Self Care | Payer: Medicare Other | Source: Ambulatory Visit | Attending: Orthopaedic Surgery | Admitting: Orthopaedic Surgery

## 2020-06-01 DIAGNOSIS — Z01812 Encounter for preprocedural laboratory examination: Secondary | ICD-10-CM | POA: Insufficient documentation

## 2020-06-01 DIAGNOSIS — Z20822 Contact with and (suspected) exposure to covid-19: Secondary | ICD-10-CM | POA: Diagnosis not present

## 2020-06-01 LAB — SARS CORONAVIRUS 2 (TAT 6-24 HRS): SARS Coronavirus 2: NEGATIVE

## 2020-06-03 MED ORDER — TRANEXAMIC ACID 1000 MG/10ML IV SOLN
2000.0000 mg | INTRAVENOUS | Status: DC
  Filled 2020-06-03: qty 20

## 2020-06-03 NOTE — Anesthesia Preprocedure Evaluation (Addendum)
Anesthesia Evaluation  Patient identified by MRN, date of birth, ID band Patient awake    Reviewed: Allergy & Precautions, NPO status , Patient's Chart, lab work & pertinent test results  History of Anesthesia Complications Negative for: history of anesthetic complications  Airway Mallampati: II  TM Distance: >3 FB Neck ROM: Full    Dental  (+) Teeth Intact   Pulmonary neg pulmonary ROS,    Pulmonary exam normal        Cardiovascular hypertension, Pt. on medications Normal cardiovascular exam  H/o syncopal episode with extensive workup that was negative. Had POET in 2020 that was read as positive, but personally reviewed by Dr. Percival Spanish and determined to be low risk.   Echo 01/08/18: LVEF 65-70%, normal wall thickness, normal wall motion, grade 1 DD, indeterminate LV filling pressure, aortic sclerosis with mild AI, trivial MR, normal LA size, trivial TR, RVSP 23 mmHg, normal IVC   Neuro/Psych Depression negative neurological ROS     GI/Hepatic Neg liver ROS, GERD  ,  Endo/Other  diabetes (pre-DM)  Renal/GU negative Renal ROS  negative genitourinary   Musculoskeletal  (+) Arthritis ,   Abdominal   Peds  Hematology negative hematology ROS (+)   Anesthesia Other Findings   Reproductive/Obstetrics                            Anesthesia Physical Anesthesia Plan  ASA: II  Anesthesia Plan: General   Post-op Pain Management: GA combined w/ Regional for post-op pain   Induction: Intravenous  PONV Risk Score and Plan: 3 and Ondansetron, Dexamethasone, Treatment may vary due to age or medical condition and Midazolam  Airway Management Planned: Oral ETT  Additional Equipment: None  Intra-op Plan:   Post-operative Plan: Extubation in OR  Informed Consent: I have reviewed the patients History and Physical, chart, labs and discussed the procedure including the risks, benefits and  alternatives for the proposed anesthesia with the patient or authorized representative who has indicated his/her understanding and acceptance.     Dental advisory given  Plan Discussed with:   Anesthesia Plan Comments:        Anesthesia Quick Evaluation

## 2020-06-04 ENCOUNTER — Other Ambulatory Visit: Payer: Self-pay

## 2020-06-04 ENCOUNTER — Encounter (HOSPITAL_COMMUNITY): Payer: Self-pay | Admitting: Orthopaedic Surgery

## 2020-06-04 ENCOUNTER — Observation Stay (HOSPITAL_COMMUNITY)
Admission: RE | Admit: 2020-06-04 | Discharge: 2020-06-05 | Disposition: A | Discharge status: Home / Self Care | Payer: Medicare Other | Attending: Orthopaedic Surgery | Admitting: Orthopaedic Surgery

## 2020-06-04 ENCOUNTER — Encounter (HOSPITAL_COMMUNITY)
Admission: RE | Disposition: A | Discharge status: Home / Self Care | Payer: Self-pay | Source: Home / Self Care | Attending: Orthopaedic Surgery

## 2020-06-04 ENCOUNTER — Ambulatory Visit (HOSPITAL_COMMUNITY): Payer: Medicare Other | Admitting: Anesthesiology

## 2020-06-04 ENCOUNTER — Observation Stay (HOSPITAL_COMMUNITY): Payer: Medicare Other

## 2020-06-04 DIAGNOSIS — X58XXXA Exposure to other specified factors, initial encounter: Secondary | ICD-10-CM | POA: Insufficient documentation

## 2020-06-04 DIAGNOSIS — F419 Anxiety disorder, unspecified: Secondary | ICD-10-CM

## 2020-06-04 DIAGNOSIS — R7303 Prediabetes: Secondary | ICD-10-CM | POA: Insufficient documentation

## 2020-06-04 DIAGNOSIS — Z9181 History of falling: Secondary | ICD-10-CM | POA: Diagnosis not present

## 2020-06-04 DIAGNOSIS — F32A Depression, unspecified: Secondary | ICD-10-CM

## 2020-06-04 DIAGNOSIS — R2681 Unsteadiness on feet: Secondary | ICD-10-CM | POA: Diagnosis not present

## 2020-06-04 DIAGNOSIS — S42202A Unspecified fracture of upper end of left humerus, initial encounter for closed fracture: Secondary | ICD-10-CM | POA: Diagnosis present

## 2020-06-04 DIAGNOSIS — Z79899 Other long term (current) drug therapy: Secondary | ICD-10-CM | POA: Insufficient documentation

## 2020-06-04 DIAGNOSIS — I10 Essential (primary) hypertension: Secondary | ICD-10-CM | POA: Diagnosis not present

## 2020-06-04 DIAGNOSIS — G8918 Other acute postprocedural pain: Secondary | ICD-10-CM | POA: Diagnosis not present

## 2020-06-04 DIAGNOSIS — Z471 Aftercare following joint replacement surgery: Secondary | ICD-10-CM | POA: Diagnosis not present

## 2020-06-04 DIAGNOSIS — S42292A Other displaced fracture of upper end of left humerus, initial encounter for closed fracture: Principal | ICD-10-CM | POA: Insufficient documentation

## 2020-06-04 DIAGNOSIS — N8110 Cystocele, unspecified: Secondary | ICD-10-CM | POA: Diagnosis not present

## 2020-06-04 DIAGNOSIS — Z96619 Presence of unspecified artificial shoulder joint: Secondary | ICD-10-CM

## 2020-06-04 DIAGNOSIS — Z96612 Presence of left artificial shoulder joint: Secondary | ICD-10-CM | POA: Diagnosis not present

## 2020-06-04 DIAGNOSIS — Z91040 Latex allergy status: Secondary | ICD-10-CM | POA: Diagnosis not present

## 2020-06-04 HISTORY — DX: Gastro-esophageal reflux disease without esophagitis: K21.9

## 2020-06-04 HISTORY — DX: Unspecified osteoarthritis, unspecified site: M19.90

## 2020-06-04 HISTORY — DX: Unspecified fracture of upper end of left humerus, initial encounter for closed fracture: S42.202A

## 2020-06-04 HISTORY — DX: Anesthesia of skin: R20.0

## 2020-06-04 HISTORY — DX: Personal history of other specified conditions: Z87.898

## 2020-06-04 HISTORY — DX: Prediabetes: R73.03

## 2020-06-04 HISTORY — PX: REVERSE SHOULDER ARTHROPLASTY: SHX5054

## 2020-06-04 HISTORY — DX: Personal history of other diseases of the nervous system and sense organs: Z86.69

## 2020-06-04 LAB — BASIC METABOLIC PANEL
Anion gap: 10 (ref 5–15)
BUN: 13 mg/dL (ref 8–23)
CO2: 23 mmol/L (ref 22–32)
Calcium: 9.5 mg/dL (ref 8.9–10.3)
Chloride: 105 mmol/L (ref 98–111)
Creatinine, Ser: 0.57 mg/dL (ref 0.44–1.00)
GFR, Estimated: >60 mL/min (ref >60)
Glucose, Bld: 111 mg/dL — ABNORMAL HIGH (ref 70–99)
Potassium: 3.4 mmol/L — ABNORMAL LOW (ref 3.5–5.1)
Sodium: 138 mmol/L (ref 135–145)

## 2020-06-04 LAB — HEMOGLOBIN A1C
Hgb A1c MFr Bld: 5.3 % (ref 4.8–5.6)
Mean Plasma Glucose: 105.41 mg/dL

## 2020-06-04 LAB — CBC
HCT: 42.1 % (ref 36.0–46.0)
Hemoglobin: 13.4 g/dL (ref 12.0–15.0)
MCH: 30.7 pg (ref 26.0–34.0)
MCHC: 31.8 g/dL (ref 30.0–36.0)
MCV: 96.3 fL (ref 80.0–100.0)
Platelets: 270 10*3/uL (ref 150–400)
RBC: 4.37 MIL/uL (ref 3.87–5.11)
RDW: 13 % (ref 11.5–15.5)
WBC: 6.8 10*3/uL (ref 4.0–10.5)
nRBC: 0 % (ref 0.0–0.2)

## 2020-06-04 LAB — MAGNESIUM: Magnesium: 2.1 mg/dL (ref 1.7–2.4)

## 2020-06-04 SURGERY — ARTHROPLASTY, SHOULDER, TOTAL, REVERSE
Anesthesia: General | Site: Shoulder | Laterality: Left

## 2020-06-04 MED ORDER — DEXAMETHASONE SODIUM PHOSPHATE 10 MG/ML IJ SOLN
INTRAMUSCULAR | Status: DC | PRN
  Administered 2020-06-04: 5 mg via INTRAVENOUS

## 2020-06-04 MED ORDER — BUPIVACAINE LIPOSOME 1.3 % IJ SUSP
INTRAMUSCULAR | Status: DC | PRN
  Administered 2020-06-04: 10 mL

## 2020-06-04 MED ORDER — AMISULPRIDE (ANTIEMETIC) 5 MG/2ML IV SOLN: 10.0000 mg | Freq: Once | INTRAVENOUS | Status: DC | PRN

## 2020-06-04 MED ORDER — CLINDAMYCIN PHOSPHATE 600 MG/50ML IV SOLN
600.0000 mg | Freq: Four times a day (QID) | INTRAVENOUS | Status: AC
  Administered 2020-06-04 – 2020-06-05 (×3): 600 mg via INTRAVENOUS
  Filled 2020-06-04 (×3): qty 50

## 2020-06-04 MED ORDER — PHENYLEPHRINE HCL (PRESSORS) 10 MG/ML IV SOLN
INTRAVENOUS | Status: AC
  Filled 2020-06-04: qty 1

## 2020-06-04 MED ORDER — EPHEDRINE SULFATE-NACL 50-0.9 MG/10ML-% IV SOSY
PREFILLED_SYRINGE | INTRAVENOUS | Status: DC | PRN
  Administered 2020-06-04: 5 mg via INTRAVENOUS

## 2020-06-04 MED ORDER — CLONAZEPAM 1 MG PO TABS
1.0000 mg | ORAL_TABLET | Freq: Every day | ORAL | Status: DC
  Administered 2020-06-04: 1 mg via ORAL
  Filled 2020-06-04: qty 1

## 2020-06-04 MED ORDER — VANCOMYCIN HCL 1000 MG IV SOLR
INTRAVENOUS | Status: AC
  Filled 2020-06-04: qty 1000

## 2020-06-04 MED ORDER — VANCOMYCIN HCL 1000 MG IV SOLR
INTRAVENOUS | Status: DC | PRN
  Administered 2020-06-04: 1000 mg via TOPICAL

## 2020-06-04 MED ORDER — HYDROCODONE-ACETAMINOPHEN 5-325 MG PO TABS
1.0000 | ORAL_TABLET | ORAL | Status: DC | PRN
Start: 2020-06-04 — End: 2020-06-05

## 2020-06-04 MED ORDER — ONDANSETRON HCL 4 MG/2ML IJ SOLN: 4.0000 mg | Freq: Once | INTRAMUSCULAR | Status: DC | PRN

## 2020-06-04 MED ORDER — LISINOPRIL 5 MG PO TABS
5.0000 mg | ORAL_TABLET | Freq: Every day | ORAL | Status: DC
  Filled 2020-06-04: qty 1

## 2020-06-04 MED ORDER — SERTRALINE HCL 100 MG PO TABS
100.0000 mg | ORAL_TABLET | Freq: Every day | ORAL | Status: DC
  Administered 2020-06-04 – 2020-06-05 (×2): 100 mg via ORAL
  Filled 2020-06-04 (×2): qty 1

## 2020-06-04 MED ORDER — METHOCARBAMOL 1000 MG/10ML IJ SOLN
500.0000 mg | Freq: Four times a day (QID) | INTRAVENOUS | Status: DC | PRN
  Filled 2020-06-04: qty 5

## 2020-06-04 MED ORDER — SODIUM CHLORIDE 0.9 % IR SOLN
Status: DC | PRN
  Administered 2020-06-04: 2000 mL

## 2020-06-04 MED ORDER — CHLORHEXIDINE GLUCONATE 0.12 % MT SOLN
15.0000 mL | Freq: Once | OROMUCOSAL | Status: AC
  Administered 2020-06-04: 15 mL via OROMUCOSAL

## 2020-06-04 MED ORDER — PHENOL 1.4 % MT LIQD: 1.0000 | OROMUCOSAL | Status: DC | PRN

## 2020-06-04 MED ORDER — ORAL CARE MOUTH RINSE: 15.0000 mL | Freq: Once | OROMUCOSAL | Status: AC

## 2020-06-04 MED ORDER — DOCUSATE SODIUM 100 MG PO CAPS
100.0000 mg | ORAL_CAPSULE | Freq: Two times a day (BID) | ORAL | Status: DC
  Administered 2020-06-04 – 2020-06-05 (×2): 100 mg via ORAL
  Filled 2020-06-04 (×2): qty 1

## 2020-06-04 MED ORDER — FENTANYL CITRATE (PF) 100 MCG/2ML IJ SOLN
INTRAMUSCULAR | Status: AC
  Filled 2020-06-04: qty 2

## 2020-06-04 MED ORDER — SUGAMMADEX SODIUM 200 MG/2ML IV SOLN
INTRAVENOUS | Status: DC | PRN
  Administered 2020-06-04: 150 mg via INTRAVENOUS

## 2020-06-04 MED ORDER — CLINDAMYCIN PHOSPHATE 900 MG/50ML IV SOLN
900.0000 mg | INTRAVENOUS | Status: AC
  Administered 2020-06-04: 900 mg via INTRAVENOUS
  Filled 2020-06-04: qty 50

## 2020-06-04 MED ORDER — MORPHINE SULFATE (PF) 2 MG/ML IV SOLN: 0.5000 mg | INTRAVENOUS | Status: DC | PRN

## 2020-06-04 MED ORDER — MENTHOL 3 MG MT LOZG: 1.0000 | LOZENGE | OROMUCOSAL | Status: DC | PRN

## 2020-06-04 MED ORDER — METOCLOPRAMIDE HCL 5 MG/ML IJ SOLN: 5.0000 mg | Freq: Three times a day (TID) | INTRAMUSCULAR | Status: DC | PRN

## 2020-06-04 MED ORDER — ACETAMINOPHEN 325 MG PO TABS: 325.0000 mg | ORAL_TABLET | Freq: Four times a day (QID) | ORAL | Status: DC | PRN

## 2020-06-04 MED ORDER — ONDANSETRON HCL 4 MG PO TABS: 4.0000 mg | ORAL_TABLET | Freq: Four times a day (QID) | ORAL | Status: DC | PRN

## 2020-06-04 MED ORDER — DIPHENHYDRAMINE HCL 12.5 MG/5ML PO ELIX: 12.5000 mg | ORAL_SOLUTION | ORAL | Status: DC | PRN

## 2020-06-04 MED ORDER — HYDROCODONE-ACETAMINOPHEN 7.5-325 MG PO TABS: 1.0000 | ORAL_TABLET | ORAL | Status: DC | PRN

## 2020-06-04 MED ORDER — PROPOFOL 10 MG/ML IV BOLUS
INTRAVENOUS | Status: DC | PRN
  Administered 2020-06-04: 120 mg via INTRAVENOUS

## 2020-06-04 MED ORDER — LACTATED RINGERS IV SOLN: INTRAVENOUS | Status: DC

## 2020-06-04 MED ORDER — ONDANSETRON HCL 4 MG/2ML IJ SOLN: 4.0000 mg | Freq: Four times a day (QID) | INTRAMUSCULAR | Status: DC | PRN

## 2020-06-04 MED ORDER — ONDANSETRON HCL 4 MG/2ML IJ SOLN
INTRAMUSCULAR | Status: DC | PRN
  Administered 2020-06-04: 4 mg via INTRAVENOUS

## 2020-06-04 MED ORDER — STERILE WATER FOR IRRIGATION IR SOLN
Status: DC | PRN
Start: 2020-06-04 — End: 2020-06-04
  Administered 2020-06-04: 2000 mL

## 2020-06-04 MED ORDER — EPHEDRINE 5 MG/ML INJ
INTRAVENOUS | Status: AC
  Filled 2020-06-04: qty 10

## 2020-06-04 MED ORDER — FENTANYL CITRATE (PF) 100 MCG/2ML IJ SOLN
INTRAMUSCULAR | Status: DC | PRN
  Administered 2020-06-04: 50 ug via INTRAVENOUS
  Administered 2020-06-04 (×2): 25 ug via INTRAVENOUS

## 2020-06-04 MED ORDER — POLYETHYLENE GLYCOL 3350 17 G PO PACK: 17.0000 g | PACK | Freq: Every day | ORAL | Status: DC | PRN

## 2020-06-04 MED ORDER — VITAMIN D3 25 MCG (1000 UNIT) PO TABS
2000.0000 [IU] | ORAL_TABLET | Freq: Every day | ORAL | Status: DC
  Administered 2020-06-05: 2000 [IU] via ORAL
  Filled 2020-06-04: qty 2
  Filled 2020-06-04 (×2): qty 5

## 2020-06-04 MED ORDER — OXYCODONE HCL 5 MG PO TABS: 5.0000 mg | ORAL_TABLET | Freq: Once | ORAL | Status: DC | PRN

## 2020-06-04 MED ORDER — PROPOFOL 10 MG/ML IV BOLUS
INTRAVENOUS | Status: AC
  Filled 2020-06-04: qty 40

## 2020-06-04 MED ORDER — METOCLOPRAMIDE HCL 5 MG PO TABS
5.0000 mg | ORAL_TABLET | Freq: Three times a day (TID) | ORAL | Status: DC | PRN
Start: 2020-06-04 — End: 2020-06-05

## 2020-06-04 MED ORDER — BUPIVACAINE HCL (PF) 0.5 % IJ SOLN
INTRAMUSCULAR | Status: DC | PRN
  Administered 2020-06-04: 15 mL via PERINEURAL

## 2020-06-04 MED ORDER — TRANEXAMIC ACID-NACL 1000-0.7 MG/100ML-% IV SOLN
1000.0000 mg | INTRAVENOUS | Status: AC
  Administered 2020-06-04: 1000 mg via INTRAVENOUS
  Filled 2020-06-04: qty 100

## 2020-06-04 MED ORDER — OXYCODONE HCL 5 MG/5ML PO SOLN
5.0000 mg | Freq: Once | ORAL | Status: DC | PRN
Start: 2020-06-04 — End: 2020-06-04

## 2020-06-04 MED ORDER — BUPROPION HCL ER (XL) 150 MG PO TB24
150.0000 mg | ORAL_TABLET | Freq: Every day | ORAL | Status: DC
  Administered 2020-06-05: 150 mg via ORAL
  Filled 2020-06-04: qty 1

## 2020-06-04 MED ORDER — HYDROCHLOROTHIAZIDE 25 MG PO TABS
12.5000 mg | ORAL_TABLET | Freq: Every day | ORAL | Status: DC
  Filled 2020-06-04: qty 1

## 2020-06-04 MED ORDER — RED YEAST RICE 600 MG PO CAPS
1200.0000 mg | ORAL_CAPSULE | Freq: Two times a day (BID) | ORAL | Status: DC
Start: 2020-06-04 — End: 2020-06-04

## 2020-06-04 MED ORDER — PHENYLEPHRINE HCL (PRESSORS) 10 MG/ML IV SOLN
INTRAVENOUS | Status: AC
  Filled 2020-06-04: qty 2

## 2020-06-04 MED ORDER — METHOCARBAMOL 500 MG PO TABS: 500.0000 mg | ORAL_TABLET | Freq: Four times a day (QID) | ORAL | Status: DC | PRN

## 2020-06-04 MED ORDER — LIDOCAINE 2% (20 MG/ML) 5 ML SYRINGE
INTRAMUSCULAR | Status: DC | PRN
  Administered 2020-06-04: 60 mg via INTRAVENOUS

## 2020-06-04 MED ORDER — ADULT MULTIVITAMIN W/MINERALS CH
1.0000 | ORAL_TABLET | Freq: Every day | ORAL | Status: DC
  Administered 2020-06-05: 1 via ORAL
  Filled 2020-06-04: qty 1

## 2020-06-04 MED ORDER — TRANEXAMIC ACID 1000 MG/10ML IV SOLN
INTRAVENOUS | Status: DC | PRN
  Administered 2020-06-04: 2000 mg via TOPICAL

## 2020-06-04 MED ORDER — POTASSIUM CHLORIDE CRYS ER 20 MEQ PO TBCR
40.0000 meq | EXTENDED_RELEASE_TABLET | Freq: Once | ORAL | Status: AC
  Administered 2020-06-04: 40 meq via ORAL
  Filled 2020-06-04: qty 2

## 2020-06-04 MED ORDER — FENTANYL CITRATE (PF) 100 MCG/2ML IJ SOLN
25.0000 ug | INTRAMUSCULAR | Status: DC | PRN
Start: 2020-06-04 — End: 2020-06-04

## 2020-06-04 MED ORDER — PROPOFOL 1000 MG/100ML IV EMUL
INTRAVENOUS | Status: AC
  Filled 2020-06-04: qty 100

## 2020-06-04 MED ORDER — PHENYLEPHRINE HCL-NACL 10-0.9 MG/250ML-% IV SOLN
INTRAVENOUS | Status: DC | PRN
  Administered 2020-06-04: 50 ug/min via INTRAVENOUS

## 2020-06-04 MED ORDER — ROCURONIUM BROMIDE 10 MG/ML (PF) SYRINGE
PREFILLED_SYRINGE | INTRAVENOUS | Status: DC | PRN
  Administered 2020-06-04: 5 mg via INTRAVENOUS
  Administered 2020-06-04: 50 mg via INTRAVENOUS

## 2020-06-04 SURGICAL SUPPLY — 72 items
BASEPLATE GLENIOD RSA 28 (Shoulder) ×2 IMPLANT
BIT DRILL 3.1 DIA REUNION (BIT) ×2 IMPLANT
BIT DRILL 5/64X5 DISP (BIT) IMPLANT
BLADE SAW SAG 73X25 THK (BLADE) ×1
BLADE SAW SGTL 73X25 THK (BLADE) ×1 IMPLANT
BLADE SURG 15 STRL LF DISP TIS (BLADE) ×1 IMPLANT
BLADE SURG 15 STRL SS (BLADE) ×2
BUR ROUND PRECISION 4.0 (BURR) IMPLANT
CHLORAPREP W/TINT 26 (MISCELLANEOUS) ×2 IMPLANT
COOLER ICEMAN CLASSIC (MISCELLANEOUS) ×2 IMPLANT
COVER SURGICAL LIGHT HANDLE (MISCELLANEOUS) ×2 IMPLANT
CUP HUMERAL 32X2 (Cup) ×2 IMPLANT
DRAPE INCISE IOBAN 66X45 STRL (DRAPES) ×2 IMPLANT
DRAPE ORTHO SPLIT 77X108 STRL (DRAPES) ×4
DRAPE SURG 17X23 STRL (DRAPES) ×2 IMPLANT
DRAPE SURG ORHT 6 SPLT 77X108 (DRAPES) ×2 IMPLANT
DRAPE U-SHAPE 47X51 STRL (DRAPES) ×4 IMPLANT
DRSG AQUACEL AG ADV 3.5X10 (GAUZE/BANDAGES/DRESSINGS) ×2 IMPLANT
ELECT BLADE TIP CTD 4 INCH (ELECTRODE) ×2 IMPLANT
ELECT REM PT RETURN 15FT ADLT (MISCELLANEOUS) ×2 IMPLANT
GAUZE SPONGE 4X4 12PLY STRL (GAUZE/BANDAGES/DRESSINGS) IMPLANT
GLENOID CONC REUNION 32X6 (Shoulder) ×2 IMPLANT
GLENOSPHERE RSA CONCENTRIC 32 (Shoulder) ×2 IMPLANT
GLOVE SRG 8 PF TXTR STRL LF DI (GLOVE) ×1 IMPLANT
GLOVE SURG SYN 7.5  E (GLOVE) ×2
GLOVE SURG SYN 7.5 E (GLOVE) ×1 IMPLANT
GLOVE SURG UNDER POLY LF SZ8 (GLOVE) ×2
GOWN STRL REUS W/ TWL LRG LVL3 (GOWN DISPOSABLE) ×1 IMPLANT
GOWN STRL REUS W/ TWL XL LVL3 (GOWN DISPOSABLE) ×1 IMPLANT
GOWN STRL REUS W/TWL LRG LVL3 (GOWN DISPOSABLE) ×2
GOWN STRL REUS W/TWL XL LVL3 (GOWN DISPOSABLE) ×2
GUIDEWIRE SHLD REUNION 19 (WIRE) ×2 IMPLANT
HANDPIECE INTERPULSE COAX TIP (DISPOSABLE) ×2
HEMOSTAT SURGICEL 2X14 (HEMOSTASIS) IMPLANT
INSERT HUMERAL REUNION 32X4MM (Shoulder) ×2 IMPLANT
KIT BASIN OR (CUSTOM PROCEDURE TRAY) ×2 IMPLANT
KIT TURNOVER KIT A (KITS) ×2 IMPLANT
MANIFOLD NEPTUNE II (INSTRUMENTS) ×2 IMPLANT
PACK SHOULDER (CUSTOM PROCEDURE TRAY) ×2 IMPLANT
PAD ARMBOARD 7.5X6 YLW CONV (MISCELLANEOUS) ×2 IMPLANT
PAD COLD SHLDR UNI WRAP-ON (PAD) ×2
PAD COLD UNI WRAP-ON (PAD) ×1 IMPLANT
RESTRAINT HEAD UNIVERSAL NS (MISCELLANEOUS) ×2 IMPLANT
RETRIEVER SUT HEWSON (MISCELLANEOUS) ×2 IMPLANT
SCREW CENTER PERIPH 6.5X28 (Screw) ×2 IMPLANT
SCREW PERIPHERAL 4.5X16MM (Screw) ×4 IMPLANT
SCREW PERIPHERAL 4.5X28MM (Screw) ×2 IMPLANT
SEALER BIPOLAR AQUA 6.0 (INSTRUMENTS) IMPLANT
SET HNDPC FAN SPRY TIP SCT (DISPOSABLE) ×1 IMPLANT
SLING ARM IMMOBILIZER LRG (SOFTGOODS) ×2 IMPLANT
SLING ARM IMMOBILIZER MED (SOFTGOODS) IMPLANT
SLING ARM IMMOBILIZER SM (MISCELLANEOUS) ×2 IMPLANT
SMARTMIX MINI TOWER (MISCELLANEOUS)
SPONGE LAP 18X18 RF (DISPOSABLE) ×2 IMPLANT
SPONGE LAP 4X18 RFD (DISPOSABLE) IMPLANT
STEM HUMERAL FX 6X113 (Stem) ×2 IMPLANT
STRIP CLOSURE SKIN 1/2X4 (GAUZE/BANDAGES/DRESSINGS) ×4 IMPLANT
SUCTION FRAZIER HANDLE 10FR (MISCELLANEOUS) ×2
SUCTION TUBE FRAZIER 10FR DISP (MISCELLANEOUS) ×1 IMPLANT
SUPPORT WRAP ARM LG (MISCELLANEOUS) ×2 IMPLANT
SUT FIBERWIRE #2 38 T-5 BLUE (SUTURE) ×8
SUT MNCRL AB 3-0 PS2 18 (SUTURE) ×2 IMPLANT
SUT VIC AB 0 CT1 27 (SUTURE) ×4
SUT VIC AB 0 CT1 27XBRD ANBCTR (SUTURE) ×2 IMPLANT
SUT VIC AB 2-0 CT1 27 (SUTURE) ×4
SUT VIC AB 2-0 CT1 TAPERPNT 27 (SUTURE) ×2 IMPLANT
SUTURE FIBERWR #2 38 T-5 BLUE (SUTURE) ×4 IMPLANT
TAPE FIBER 2MM 7IN #2 BLUE (SUTURE) IMPLANT
TOWEL OR 17X26 10 PK STRL BLUE (TOWEL DISPOSABLE) ×2 IMPLANT
TOWER SMARTMIX MINI (MISCELLANEOUS) IMPLANT
humeral cup (Shoulder) ×2 IMPLANT
humeral fracture stem (Shoulder) ×2 IMPLANT

## 2020-06-04 NOTE — Anesthesia Postprocedure Evaluation (Signed)
Anesthesia Post Note  Patient: Mouna Yager  Procedure(s) Performed: REVERSE SHOULDER ARTHROPLASTY (Left Shoulder)     Patient location during evaluation: PACU Anesthesia Type: General Level of consciousness: awake and alert Pain management: pain level controlled Vital Signs Assessment: post-procedure vital signs reviewed and stable Respiratory status: spontaneous breathing, nonlabored ventilation and respiratory function stable Cardiovascular status: blood pressure returned to baseline and stable Postop Assessment: no apparent nausea or vomiting Anesthetic complications: no   No complications documented.  Last Vitals:  Vitals:   06/04/20 1100 06/04/20 1115  BP: 111/60 (!) 104/55  Pulse: 100 94  Resp:  16  Temp:  36.8 C  SpO2: 95% 95%    Last Pain:  Vitals:   06/04/20 1115  TempSrc:   PainSc: 0-No pain                 Lidia Collum

## 2020-06-04 NOTE — Op Note (Signed)
PREOPERATIVE DIAGNOSIS: Left shoulder impacted and displaced proximal humerus fracture  POSTOPERATIVE DIAGNOSIS: Same  ATTENDING PHYSICIAN: Maudry Mayhew. Jeannie Fend, III, MD who was present and scrubbed for the entire case   ASSISTANT SURGEON: None.   ANESTHESIA: General with regional  SURGICAL PROCEDURES: Left shoulder reverse arthroplasty for fracture  SURGICAL INDICATIONS: Patient is an 80 year old female who on 04/17/2020 had a fall onto the left side.  She was initially seen at outside facility where radiographs were obtained.  This showed a left proximal humerus fracture.  She was placed to a sling and sent to see me in clinic nearly a month later.  Repeat radiographs showed persistent displacement and impaction of her left proximal humerus fracture with some early evidence of some mild callus formation.  We had a long discussion regarding both operative and nonoperative treatment measures.  Patient did wish to initially trialed nonoperative management think about surgical intervention.  After living with this for an additional 2 weeks, she did decide to proceed forward with surgical intervention.  We had a discussion regarding the risk and benefits of her left shoulder reverse arthroplasty and she presents today for operative management.  FINDING: An impacted impending malunion of the left proximal humerus fracture was identified.  Fracture fragments were mobilized and successful left shoulder reverse arthroplasty was performed using a Stryker reunion fracture system.  DESCRIPTION OF PROCEDURE: Patient was identified in the preoperative where the risk benefits and alternatives of the procedure were once again discussed with the patient.  These risks include but not limited to infection, bleeding, damage to surrounding structures including blood vessels and nerves, pain, stiffness, implant failure need for additional procedures.  Informed consent was obtained that time and the patient's left shoulder  was marked with a surgical marking pen.  She then underwent a left upper extremity plexus block by anesthesia.  She was brought to the operative suite where timeout was performed identifying the correct patient operative site.  The patient was induced under general anesthesia and sat upright in the beachchair position.  All bony prominences were well-padded and her head was positioned appropriately.  Preoperative antibiotics as well as intravenous TXA was administered.  Left shoulder was then prepped and draped in usual sterile fashion.  A standard deltopectoral incision was made over the anterior aspect of the shoulder.  Dissection was carried down through subcutaneous tissues using Bovie electrocautery.  The deltopectoral interval was identified.  The vein was rather small and was cauterized to allow for members mobilization.  The clavipectoral fascia was then visualized and retractors were placed lateral to the conjoined tendon as well as within the subdeltoid space.  The abnormal proximal humerus was then visualized.  There was prominent bone and callus formation around the lateral aspect of the proximal humerus.  This was gently debrided using a rondure.  The subscapularis was peeled off the anterior proximal humerus exposing the humeral head.  This showed an impacted humeral head fracture with medial translation of the humeral shaft.  The greater tuberosity was migrated proximally with callus formation laterally.  A osteotome was inserted along the fracture planes and this allowed for mobilization of both the humeral head fracture as well as greater tuberosity fracture.  The humeral head was removed in its entirety and set aside for later possible usage.  Further releases around the proximal humerus were performed which allowed for translation and exposure of the proximal humerus within the wound.  The supraspinatus and greater tuberosity pieces were placed posteriorly to help protect  these areas.  We then  began with preparation of the humeral shaft for the implant.  The 6 mm canal finder was placed through the fracture site and into the canal of the proximal humerus.  Sequential reaming was increased up to size 8.  There was significant chatter and fit of the size 9 which was unable to be passed.  The size 6 trial stem and broach was then placed into the humeral shaft from the Stryker fracture system.  This was expanded to allow for press-fit fixation of the trial in 30 degrees of retroversion.  Attention was then turned to the glenoid.  Releases were made circumferentially around the glenoid to allow for visualization including mobilization of both the subscapularis and supraspinatus.  Care was taken to protect the proximal humeral shaft.  Once full visualization of the glenoid was able to be established, using the guide for the glenoid the pin was placed into the central portion of the glenoid.  This had bicortical fixation.  The reamers were then placed over top of the guidepin and used to ream preferentially the inferior aspect of the glenoid.  The guidepin was removed and the baseplate was placed in the through the glenoid.  A 73mm central screw was placed, securing the glenoid baseplate with excellent fixation and compression.  3 peripheral screws were able to be inserted with then placed inferior, superior and posteriorly.  Due to the small size of the glenoid, and anterior screw was unable to obtain purchase.  A 36+6 glenosphere was then impacted on the [facility] taper successfully.  Attention was then turned back to the proximal humerus.  Trial implants were placed on the expandable broach stem.  Unfortunately the shoulder was too tight to be reduced even with the smallest implants available.  The trial broach stem was removed and a small portion of the proximal humerus was resected using oscillating saw to allow for deeper seating of the stem.  Once this was performed the trial broach was once again  inserted with the smallest implants available.  Unfortunately still this provided shoulder which was too stiff and unable to be reduced.  The trial implants were then all removed and attention was turned back to the glenoid.  The 36+6 glenosphere was removed and a new 36+2 glenosphere was reimpacted onto the [facility] taper successfully.  The trial humeral components were then placed once again and the shoulder was able to be reduced with good range of motion including forward flexion, external rotation and internal rotation without evidence of impingement.  At this point the wound was copiously irrigated with normal saline via pulse lavage.  The size 6 Stryker fracture stem with the 2 mm tray and 4 mm polywere then impacted and inserted into the proximal humerus in 30 degrees of retroversion.  This had excellent press-fit.  All retractors were removed and the shoulder was again reduced.  This had a tight fit with good adequate range of motion in terms of forward flexion, external rotation and internal rotation without significance of the bony impingement.  Due to the tight nature of the shoulder though the greater tuberosity fragment was unable to be reduced down to the lateral aspect of proximal humerus.  It also impinged on the acromion with shoulder range of motion.  The decision was made to excise the greater tuberosity fragment she had good fixation of the humeral stem and the remaining bone proximally.  The wound was again copiously irrigated with normal saline.  Topical TXA as  well as vancomycin powder were placed in the wound.  The deltopectoral interval was closed with interrupted 0 Vicryl sutures.  Skin was closed with interrupted 2-0 Vicryl sutures followed by a running 3-0 Monocryl suture and application of Steri-Strips.  Aquacel dressing was placed on the wound and the arm was placed to a sling.  Patient was awoken from her anesthesia and extubated in the operating room without any complications.  She  was taken the PACU in stable condition.  She tolerated the procedure well and there were no complications.  ESTIMATED BLOOD LOSS: 200 mL  TOURNIQUET TIME: None  SPECIMENS: None  POSTOPERATIVE PLAN: Patient will be admitted overnight for observation.  Hospitalist service will be consulted for comanagement of her medical issues.  We will plan for discharge home in the morning and follow-up with me in 10 to 14days.  She is to remain in her sling full-time until 6 weeks postoperatively except for gentle pendulums and hygiene to allow for her implant and soft tissues to stabilize.  IMPLANTS: Stryker reunion fracture stem size [phone] with 28 mm baseplate and 33+4 glenosphere. 28 mm central screw. Peripheral screws 16, 28, 16 mm

## 2020-06-04 NOTE — Anesthesia Procedure Notes (Signed)
Anesthesia Regional Block: Interscalene brachial plexus block   Pre-Anesthetic Checklist: ,, timeout performed, Correct Patient, Correct Site, Correct Laterality, Correct Procedure, Correct Position, site marked, Risks and benefits discussed,  Surgical consent,  Pre-op evaluation,  At surgeon's request and post-op pain management  Laterality: Left  Prep: chloraprep       Needles:  Injection technique: Single-shot  Needle Type: Echogenic Stimulator Needle     Needle Length: 10cm  Needle Gauge: 20     Additional Needles:   Procedures:,,,, ultrasound used (permanent image in chart),,,,  Narrative:  Start time: 06/04/2020 6:56 AM End time: 06/04/2020 7:01 AM Injection made incrementally with aspirations every 5 mL.  Performed by: Personally  Anesthesiologist: Lidia Collum, MD  Additional Notes: Standard monitors applied. Skin prepped. Good needle visualization with ultrasound. Injection made in 5cc increments with no resistance to injection. Patient tolerated the procedure well.

## 2020-06-04 NOTE — Transfer of Care (Signed)
Immediate Anesthesia Transfer of Care Note  Patient: Lisa Morton  Procedure(s) Performed: Procedure(s) with comments: REVERSE SHOULDER ARTHROPLASTY (Left) - with block  Patient Location: PACU  Anesthesia Type:GA combined with regional for post-op pain  Level of Consciousness:  sedated, patient cooperative and responds to stimulation  Airway & Oxygen Therapy:Patient Spontanous Breathing and Patient connected to face mask oxgen  Post-op Assessment:  Report given to PACU RN and Post -op Vital signs reviewed and stable  Post vital signs:  Reviewed and stable  Last Vitals:  Vitals:   06/04/20 0606  BP: (!) 145/70  Pulse: 93  Resp: 16  Temp: 36.4 C  SpO2: 967%    Complications: No apparent anesthesia complications

## 2020-06-04 NOTE — Consult Note (Signed)
Medical Consultation  Lisa Morton BZJ:[id number] DOB: Aug 09, 1940 DOA: 06/04/2020 PCP: Maurice Small, MD   Requesting physician: Dr. Jeannie Fend Date of consultation: 06/04/20 Reason for consultation: Medical Mgmt  Impression/Recommendations Left shoulder impacted and displaced proximal humerus fracture     - now s/p left shoulder reverse arthroplasty     - admitted to ortho as primary; plan, including pain control, PT/OT, per them  HTN     - BP is ok; can resume home meds  Hypokalemia     - mild     - add PO K+; check Mg2+  Depression/Anxiety     - continue zoloft, welbutrin  She is currently on medication for HTN, Depression/Anxiety. She takes OTCs for her cholesterol. She is not able to tell me any more at this time and I am unable to see [facility] PCP records. Her BP is well controled at this point and we can start her medications back. She just had anesthesia for surgery and is somewhat groggy and confused. We can start her anxiety medication back, but will do this slowly. She is found to have hypokalemia. We will give her a OT dose of PO K+ and check her Mg2+. We will follow up her labs in the AM. She has no medications in her med history for DM and she denies having DM. For now, she is ok to have a heart healthy diet. Follow AM glucose. The current plan is for overnight observation to the orthopedic service.  [facility] will follow-up again tomorrow. Please contact me if I can be of assistance in the meanwhile. Thank you for this consultation.  Chief Complaint: Shoulder pain.  HPI:  Lisa Morton is a 80 y.o. female with medical history significant of HTN, anxiety/depression. Presenting for left shoulder arthroplasty. She reports that she had a fall at the end of January/begining of February this year. She has been dealing with shoulder pain since that fall. She was initially placed in a sling and recommended follow up with orthopedics. After initial consultation with ortho, the patient  wanted to try a non-operative approach. When this didn't improve her condition, she agreed to surgery. That procedure was successfully performed today. Orthopedics is requesting assistance with medical management of her chronic conditions.  Review of Systems:  Denies CP, dyspnea, palpitation, N/V/D, fevers, syncopal episodes. Remainder of ROS is negative for all not mentioned in HPI.  Past Medical History:  Diagnosis Date  . Depression   . GERD (gastroesophageal reflux disease)    Rare  . History of migraine   . History of syncope    Full negative cardiac workup  . Hyperlipemia   . Hypertension   . Numbness    Left heel, sharp itching pain  . OA (osteoarthritis)    lumbar spine, left hip  . Pre-diabetes   . Restless leg syndrome    on klonidipin   Past Surgical History:  Procedure Laterality Date  . ABDOMINAL HYSTERECTOMY  1984  . CATARACT EXTRACTION  07/2011  . CESAREAN SECTION     x2  . COLONOSCOPY    . CYSTOCELE REPAIR  01/26/2012   Procedure: ANTERIOR REPAIR (CYSTOCELE);  Surgeon: Thurnell Lose, MD;  Location: [facility];  Service: Gynecology;  Laterality: N/A;   Social History:  reports that she has never smoked. She has never used smokeless tobacco. She reports that she does not drink alcohol and does not use drugs.  Allergies  Allergen Reactions  . Codeine Nausea And Vomiting  . Latex Rash  Prolonged exposure to skin  . Penicillins Rash   Family History  Problem Relation Age of Onset  . Cancer Mother   . Heart attack Father        No details died at age 27  . Bipolar disorder Sister   . Breast cancer Maternal Grandmother     Prior to Admission medications   Medication Sig Start Date End Date Taking? Authorizing Provider  Bioflavonoid Products (ESTER C PO) Take 1 tablet by mouth daily.   Yes [provider]  buPROPion (WELLBUTRIN XL) 150 MG 24 hr tablet Take 150 mg by mouth daily.   Yes [provider]  Calcium-Magnesium-Zinc (CAL-MAG-ZINC  PO) Take 1 tablet by mouth daily.   Yes [provider]  Cholecalciferol (VITAMIN D) 50 MCG (2000 UT) CAPS Take 2,000 Units by mouth daily.   Yes [provider]  clonazePAM (KLONOPIN) 1 MG tablet Take 1 mg by mouth at bedtime.    Yes [provider]  hydrochlorothiazide (HYDRODIURIL) 25 MG tablet Take 12.5 mg by mouth daily.   Yes [provider]  lisinopril (PRINIVIL,ZESTRIL) 5 MG tablet Take 5 mg by mouth daily.   Yes [provider]  Multiple Vitamins-Minerals (CENTRUM SILVER ADULT 50+ PO) Take 1 tablet by mouth daily.   Yes [provider]  OVER THE COUNTER MEDICATION Take 1 capsule by mouth 3 (three) times a week. Blood Builder- contains Vitamin C, Folate, Vitamin B12, and Iron. Takes on Wed and Sunday.   Yes [provider]  Red Yeast Rice 600 MG CAPS Take 1,200 mg by mouth in the morning and at bedtime.   Yes [provider]  sertraline (ZOLOFT) 100 MG tablet Take 100 mg by mouth daily. 06/30/16  Yes [provider]  vitamin B-12 (CYANOCOBALAMIN) 1000 MCG tablet Take 1,000 mcg by mouth 4 (four) times a week.    Yes [provider]   Physical Exam: Blood pressure 111/60, pulse (!) 101, temperature 98.4 F (36.9 C), resp. rate 16, height 5\' 1"  (1.549 m), weight 61.7 kg, SpO2 95 %. Vitals:   06/04/20 1030 06/04/20 1045  BP: (!) 114/59 111/60  Pulse: (!) 102 (!) 101  Resp: 17 16  Temp: 98.4 F (36.9 C)   SpO2: 100% 95%    General: 80 y.o. female resting in bed in NAD Eyes: PERRL, normal sclera ENMT: Nares patent w/o discharge, orophaynx clear, dentition normal, ears w/o discharge/lesions/ulcers Neck: Supple, trachea midline Cardiovascular: RRR, +S1, S2, no m/g/r, equal pulses throughout Respiratory: CTABL, no w/r/r, normal WOB GI: BS+, NDNT, no masses noted, no organomegaly noted MSK: No e/c/c; LUE in sling Skin: No rashes, bruises, ulcerations noted Neuro: A&O x 3, no focal deficits, she is  somewhat groggy and confused in her answering Psyc: calm/cooperative  Labs on Admission:  Basic Metabolic Panel: Recent Labs  Lab 06/04/20 0606  NA 138  K 3.4*  CL 105  CO2 23  GLUCOSE 111*  BUN 13  CREATININE 0.57  CALCIUM 9.5   Liver Function Tests: No results for input(s): AST, ALT, ALKPHOS, BILITOT, PROT, ALBUMIN in the last 168 hours. No results for input(s): LIPASE, AMYLASE in the last 168 hours. No results for input(s): AMMONIA in the last 168 hours. CBC: Recent Labs  Lab 06/04/20 0606  WBC 6.8  HGB 13.4  HCT 42.1  MCV 96.3  PLT 270   Cardiac Enzymes: No results for input(s): CKTOTAL, CKMB, CKMBINDEX, TROPONINI in the last 168 hours. BNP: Invalid input(s): POCBNP CBG: No  results for input(s): GLUCAP in the last 168 hours.  Radiological Exams on Admission: No results found.  EKG: Independently reviewed. NSR, no st elevation  Time spent: 50 minutes  Adebayo Ensminger A Pecola Haxton DO [facility]  If 7PM-7AM, please contact night-coverage [email or web address] 06/04/2020, 10:49 AM

## 2020-06-04 NOTE — H&P (Signed)
ORTHOPAEDIC H&P  PCP:  Maurice Small, MD  Chief Complaint: Left shoulder fracture  HPI: Lisa Morton is a 80 y.o. female who complains of left shoulder fracture.  On 04/17/2020 patient had a fall onto the left side.  She was initially seen at outside facility where she was found to have a comminuted and displaced left proximal humerus fracture.  She underwent initial conservative treatment but then was sent to see me for further recommendations.  Repeat radiographs showed persistent impaction and displacement of the left proximal humerus fracture.  We discussed treatment options together in clinic and she initially requested some time to think about her options.  After about 2 weeks, she did wish to proceed forward with left shoulder reverse arthroplasty.  We discussed risk and benefits of surgery in clinic initially.  And after having this discussion overall she did wish to finally proceed forward with a left shoulder reverse arthroplasty for fracture and she presents today for operative management.  Past Medical History:  Diagnosis Date  . Depression   . GERD (gastroesophageal reflux disease)    Rare  . History of migraine   . History of syncope    Full negative cardiac workup  . Hyperlipemia   . Hypertension   . Numbness    Left heel, sharp itching pain  . OA (osteoarthritis)    lumbar spine, left hip  . Pre-diabetes   . Restless leg syndrome    on klonidipin   Past Surgical History:  Procedure Laterality Date  . ABDOMINAL HYSTERECTOMY  1984  . CATARACT EXTRACTION  07/2011  . CESAREAN SECTION     x2  . COLONOSCOPY    . CYSTOCELE REPAIR  01/26/2012   Procedure: ANTERIOR REPAIR (CYSTOCELE);  Surgeon: Thurnell Lose, MD;  [facility];  Service: Gynecology;  Laterality: N/A;   Social History   Socioeconomic History  . Marital status: Married    Spouse name: Not on file  . Number of children: Not on file  . Years of education: Not on file  . Highest education level:  Not on file  Occupational History  . Not on file  Tobacco Use  . Smoking status: Never Smoker  . Smokeless tobacco: Never Used  Vaping Use  . Vaping Use: Never used  Substance and Sexual Activity  . Alcohol use: No  . Drug use: No  . Sexual activity: Not on file  Other Topics Concern  . Not on file  Social History Narrative   Lives with husband.  Two children.  4 grands.     Social Determinants of Health   Financial Resource Strain: Not on file  Food Insecurity: Not on file  Transportation Needs: Not on file  Physical Activity: Not on file  Stress: Not on file  Social Connections: Not on file   Family History  Problem Relation Age of Onset  . Cancer Mother   . Heart attack Father        No details died at age 31  . Bipolar disorder Sister   . Breast cancer Maternal Grandmother    Allergies  Allergen Reactions  . Codeine Nausea And Vomiting  . Latex Rash    Prolonged exposure to skin  . Penicillins Rash   Prior to Admission medications   Medication Sig Start Date End Date Taking? Authorizing Provider  Bioflavonoid Products (ESTER C PO) Take 1 tablet by mouth daily.   Yes [provider]  buPROPion (WELLBUTRIN XL) 150 MG 24 hr tablet  Take 150 mg by mouth daily.   Yes [provider]  Calcium-Magnesium-Zinc (CAL-MAG-ZINC PO) Take 1 tablet by mouth daily.   Yes [provider]  Cholecalciferol (VITAMIN D) 50 MCG (2000 UT) CAPS Take 2,000 Units by mouth daily.   Yes [provider]  clonazePAM (KLONOPIN) 1 MG tablet Take 1 mg by mouth at bedtime.    Yes [provider]  hydrochlorothiazide (HYDRODIURIL) 25 MG tablet Take 12.5 mg by mouth daily.   Yes [provider]  lisinopril (PRINIVIL,ZESTRIL) 5 MG tablet Take 5 mg by mouth daily.   Yes [provider]  Multiple Vitamins-Minerals (CENTRUM SILVER ADULT 50+ PO) Take 1 tablet by mouth daily.   Yes [provider]  OVER THE COUNTER MEDICATION Take 1  capsule by mouth 3 (three) times a week. Blood Builder- contains Vitamin C, Folate, Vitamin B12, and Iron. Takes on Wed and Sunday.   Yes [provider]  Red Yeast Rice 600 MG CAPS Take 1,200 mg by mouth in the morning and at bedtime.   Yes [provider]  sertraline (ZOLOFT) 100 MG tablet Take 100 mg by mouth daily. 06/30/16  Yes [provider]  vitamin B-12 (CYANOCOBALAMIN) 1000 MCG tablet Take 1,000 mcg by mouth 4 (four) times a week.    Yes [provider]   No results found.  Positive ROS: All other systems have been reviewed and were otherwise negative with the exception of those mentioned in the HPI and as above.  Physical Exam: General: Alert, no acute distress Cardiovascular: No edema Respiratory: No cyanosis, no use of accessory musculature Skin: No lesions in the area of chief complaint  Psychiatric: Patient is competent for consent with normal mood and affect  MUSCULOSKELETAL: Tenderness palpation around the left shoulder.  Minimal active or passive range of motion to the shoulder secondary to pain.  Motor intact to the AIN, PIN and ulnar nerve distributions.  Fingertips are warm well perfused with brisk capillary refill.  Intact sensation throughout all digits.  Assessment: Impacted and displaced multifragment left proximal humerus fracture  Plan: Plan to proceed forward with left shoulder reverse arthroplasty for fracture.  Risk, benefits and alternatives of the procedure were once again discussed with the patient.  These risks include but are not limited to infection, bleeding, damage to surrounding structures including blood vessels and nerves, pain, stiffness, implant failure and need for additional procedures.  Informed consent was obtained and the patient's left shoulder was marked.  Plan for admission and observation overnight with discharge home in the morning.    Verner Mould, MD [phone]   06/04/2020 7:10  AM

## 2020-06-04 NOTE — Anesthesia Procedure Notes (Signed)
Procedure Name: Intubation Date/Time: 06/04/2020 7:45 AM Performed by: Lavina Hamman, CRNA Pre-anesthesia Checklist: Patient identified, Emergency Drugs available, Suction available, Patient being monitored and Timeout performed Patient Re-evaluated:Patient Re-evaluated prior to induction Oxygen Delivery Method: Circle system utilized Preoxygenation: Pre-oxygenation with 100% oxygen Induction Type: IV induction Ventilation: Mask ventilation without difficulty Laryngoscope Size: Mac and 3 Grade View: Grade I Tube type: Oral Tube size: 7.0 mm Number of attempts: 1 Airway Equipment and Method: Stylet Placement Confirmation: ETT inserted through vocal cords under direct vision,  positive ETCO2,  CO2 detector and breath sounds checked- equal and bilateral Secured at: 21 cm Tube secured with: Tape Dental Injury: Teeth and Oropharynx as per pre-operative assessment  Comments: ATOI.  Teeth unchanged.

## 2020-06-04 NOTE — Evaluation (Signed)
Physical Therapy Evaluation Patient Details Name: Lisa Morton MRN: [id number] DOB: March 11, 1941 Today's Date: 06/04/2020   History of Present Illness  80 y.o. female s/p Lt TSA (reverse)  on 06/04/2020 following a communiated and displaced humeral fracture from a fall withP MH significant for OA, HTN, HLD, syncope, depression, and GERD.  Clinical Impression  Pt is an 80y.o. female s/p Lt TSA (reverse) POD 0. Pt reports that she is modified independent with use of cane for mobility at baseline. Pt with sling donned throughout session to protect Lt UE with cues for NWB status. Pt required MIN assist with cues for safe hand placement for sit to stand transfer. Pt required MIN assist for safety/stability as pt displayed intermittent lateral drifitng during ambulation121ft with verbal cues for proper cane progression. Pt will have intermittent assistance from her neighbors, daughters, and son-in laws upon discharge. Recommend home with intermittent family/neighbor assist and HHPT to improve balance and safety with mobility. Pt will benefit from skilled PT to increase independence and safety with mobility. Acute therapy to follow up during stay to progress functional mobility as able to ensure safe dischare home.       Follow Up Recommendations Home health PT    Equipment Recommendations  None recommended by PT    Recommendations for Other Services       Precautions / Restrictions Precautions Precautions: Fall Required Braces or Orthoses: Sling (at all times) Restrictions Weight Bearing Restrictions: Yes LUE Weight Bearing: Non weight bearing Other Position/Activity Restrictions: NO AROM/PROM of shoulder; sling at all times      Mobility  Bed Mobility Overal bed mobility: Needs Assistance Bed Mobility: Supine to Sit     Supine to sit: Min assist;HOB elevated     General bed mobility comments: MIN assist for trunk to upright with use of chuck pad to assist with scoot to EOB. Pt  withuse of Rt UE on bedrail to assist. Pt reports that she has a handrail on her bed that she uses for bed mobility at baseline.    Transfers Overall transfer level: Needs assistance Equipment used: Rolling walker (2 wheeled) Transfers: Sit to/from Stand Sit to Stand: Min assist         General transfer comment: x2; MIN assist for safety/stabiltiy with sit to stand from EOB and BSC.  Ambulation/Gait Ambulation/Gait assistance: Min assist Gait Distance (Feet): 120 Feet Assistive device: Straight cane Gait Pattern/deviations: Step-through pattern;Drifts right/left Gait velocity: fair   General Gait Details: MIN assist for safety/stability and cues to continue progressing cane forward when walking. Pt displayed some drifting and required cues for obstacle negotiation to protect Lt shoulder. Pt reports that she is at baseline for ambulation with cane.  Stairs            Wheelchair Mobility    Modified Rankin (Stroke Patients Only)       Balance Overall balance assessment: Needs assistance Sitting-balance support: Feet supported Sitting balance-Leahy Scale: Fair     Standing balance support: Single extremity supported;During functional activity Standing balance-Leahy Scale: Fair Standing balance comment: pt was able to maintain static standing balance without UE support and assist from therapist                             Pertinent Vitals/Pain Pain Assessment: No/denies pain    Home Living Family/patient expects to be discharged to:: Private residence Living Arrangements: Alone Available Help at Discharge: Neighbor;Family Type of Home: House Home Access: Stairs  to enter Entrance Stairs-Rails: Right;Left Entrance Stairs-Number of Steps: 5 Home Layout: One level Home Equipment: Cane - quad;Cane - single point;Bedside commode Additional Comments: Pt will have intermittent assist from neighbors, daughters, and son- in laws who live close by. She can  have some daytime and intermittent over night supervision but not 24/7. Pt reports that her daughter does her grocery shopping and that she does not drive anymore.    Prior Function Level of Independence: Independent with assistive device(s)         Comments: use of cane at home. Pt reports history of falls     Hand Dominance   Dominant Hand: Right    Extremity/Trunk Assessment   Upper Extremity Assessment Upper Extremity Assessment: Defer to OT evaluation    Lower Extremity Assessment Lower Extremity Assessment: Generalized weakness    Cervical / Trunk Assessment Cervical / Trunk Assessment: Kyphotic  Communication   Communication: No difficulties  Cognition Arousal/Alertness: Awake/alert Behavior During Therapy: WFL for tasks assessed/performed Overall Cognitive Status: Within Functional Limits for tasks assessed                                        General Comments      Exercises     Assessment/Plan    PT Assessment Patient needs continued PT services  PT Problem List Decreased strength;Decreased activity tolerance;Decreased balance;Decreased mobility;Decreased knowledge of use of DME       PT Treatment Interventions DME instruction;Gait training;Stair training;Functional mobility training;Therapeutic activities;Therapeutic exercise;Balance training;Patient/family education    PT Goals (Current goals can be found in the Care Plan section)  Acute Rehab PT Goals Patient Stated Goal: none stated PT Goal Formulation: With patient Time For Goal Achievement: 06/11/20 Potential to Achieve Goals: Good    Frequency 7X/week   Barriers to discharge        Co-evaluation               AM-PAC PT "6 Clicks" Mobility  Outcome Measure Help needed turning from your back to your side while in a flat bed without using bedrails?: A Little Help needed moving from lying on your back to sitting on the side of a flat bed without using bedrails?: A  Little Help needed moving to and from a bed to a chair (including a wheelchair)?: A Little Help needed standing up from a chair using your arms (e.g., wheelchair or bedside chair)?: A Little Help needed to walk in hospital room?: A Little Help needed climbing 3-5 steps with a railing? : A Lot 6 Click Score: 17    End of Session Equipment Utilized During Treatment: Gait belt Activity Tolerance: Patient tolerated treatment well Patient left: in chair;with call bell/phone within reach;with chair alarm set Nurse Communication: Mobility status PT Visit Diagnosis: Unsteadiness on feet (R26.81);History of falling (Z91.81)    Time: 7564-3329 PT Time Calculation (min) (ACUTE ONLY): 31 min   Charges:              Elna Breslow, SPT  Acute rehab   Elna Breslow 06/04/2020, 5:31 PM

## 2020-06-05 ENCOUNTER — Encounter (HOSPITAL_COMMUNITY): Payer: Self-pay | Admitting: Orthopaedic Surgery

## 2020-06-05 DIAGNOSIS — Z9181 History of falling: Secondary | ICD-10-CM | POA: Diagnosis not present

## 2020-06-05 DIAGNOSIS — F32A Depression, unspecified: Secondary | ICD-10-CM | POA: Diagnosis not present

## 2020-06-05 DIAGNOSIS — Z79899 Other long term (current) drug therapy: Secondary | ICD-10-CM | POA: Diagnosis not present

## 2020-06-05 DIAGNOSIS — R7989 Other specified abnormal findings of blood chemistry: Secondary | ICD-10-CM | POA: Diagnosis not present

## 2020-06-05 DIAGNOSIS — R7303 Prediabetes: Secondary | ICD-10-CM | POA: Diagnosis not present

## 2020-06-05 DIAGNOSIS — S42292A Other displaced fracture of upper end of left humerus, initial encounter for closed fracture: Secondary | ICD-10-CM | POA: Diagnosis not present

## 2020-06-05 DIAGNOSIS — R2681 Unsteadiness on feet: Secondary | ICD-10-CM | POA: Diagnosis not present

## 2020-06-05 DIAGNOSIS — I10 Essential (primary) hypertension: Secondary | ICD-10-CM | POA: Diagnosis not present

## 2020-06-05 DIAGNOSIS — F419 Anxiety disorder, unspecified: Secondary | ICD-10-CM

## 2020-06-05 DIAGNOSIS — Z91040 Latex allergy status: Secondary | ICD-10-CM | POA: Diagnosis not present

## 2020-06-05 LAB — COMPREHENSIVE METABOLIC PANEL
ALT: 18 U/L (ref 0–44)
AST: 23 U/L (ref 15–41)
Albumin: 2.9 g/dL — ABNORMAL LOW (ref 3.5–5.0)
Alkaline Phosphatase: 46 U/L (ref 38–126)
Anion gap: 11 (ref 5–15)
BUN: 10 mg/dL (ref 8–23)
CO2: 20 mmol/L — ABNORMAL LOW (ref 22–32)
Calcium: 7.9 mg/dL — ABNORMAL LOW (ref 8.9–10.3)
Chloride: 96 mmol/L — ABNORMAL LOW (ref 98–111)
Creatinine, Ser: 0.55 mg/dL (ref 0.44–1.00)
GFR, Estimated: >60 mL/min (ref >60)
Glucose, Bld: 493 mg/dL — ABNORMAL HIGH (ref 70–99)
Potassium: 3.5 mmol/L (ref 3.5–5.1)
Sodium: 127 mmol/L — ABNORMAL LOW (ref 135–145)
Total Bilirubin: 0.6 mg/dL (ref 0.3–1.2)
Total Protein: 5.2 g/dL — ABNORMAL LOW (ref 6.5–8.1)

## 2020-06-05 LAB — CBC WITH DIFFERENTIAL/PLATELET
Abs Immature Granulocytes: 0.07 10*3/uL (ref 0.00–0.07)
Basophils Absolute: 0.1 10*3/uL (ref 0.0–0.1)
Basophils Relative: 1 %
Eosinophils Absolute: 0 10*3/uL (ref 0.0–0.5)
Eosinophils Relative: 0 %
HCT: 32.8 % — ABNORMAL LOW (ref 36.0–46.0)
Hemoglobin: 10.3 g/dL — ABNORMAL LOW (ref 12.0–15.0)
Immature Granulocytes: 1 %
Lymphocytes Relative: 20 %
Lymphs Abs: 2.1 10*3/uL (ref 0.7–4.0)
MCH: 31.1 pg (ref 26.0–34.0)
MCHC: 31.4 g/dL (ref 30.0–36.0)
MCV: 99.1 fL (ref 80.0–100.0)
Monocytes Absolute: 1.5 10*3/uL — ABNORMAL HIGH (ref 0.1–1.0)
Monocytes Relative: 14 %
Neutro Abs: 6.7 10*3/uL (ref 1.7–7.7)
Neutrophils Relative %: 64 %
Platelets: 195 10*3/uL (ref 150–400)
RBC: 3.31 MIL/uL — ABNORMAL LOW (ref 3.87–5.11)
RDW: 13.3 % (ref 11.5–15.5)
WBC: 10.5 10*3/uL (ref 4.0–10.5)
nRBC: 0.6 % — ABNORMAL HIGH (ref 0.0–0.2)

## 2020-06-05 LAB — BASIC METABOLIC PANEL
Anion gap: 8 (ref 5–15)
BUN: 10 mg/dL (ref 8–23)
CO2: 23 mmol/L (ref 22–32)
Calcium: 8.8 mg/dL — ABNORMAL LOW (ref 8.9–10.3)
Chloride: 105 mmol/L (ref 98–111)
Creatinine, Ser: 0.57 mg/dL (ref 0.44–1.00)
GFR, Estimated: >60 mL/min (ref >60)
Glucose, Bld: 129 mg/dL — ABNORMAL HIGH (ref 70–99)
Potassium: 3.8 mmol/L (ref 3.5–5.1)
Sodium: 136 mmol/L (ref 135–145)

## 2020-06-05 LAB — GLUCOSE, CAPILLARY: Glucose-Capillary: 138 mg/dL — ABNORMAL HIGH (ref 70–99)

## 2020-06-05 MED ORDER — HYDROCODONE-ACETAMINOPHEN 5-325 MG PO TABS: 1.0000 | ORAL_TABLET | ORAL | 0 refills | Status: DC | PRN

## 2020-06-05 MED ORDER — METHOCARBAMOL 500 MG PO TABS: 500.0000 mg | ORAL_TABLET | Freq: Four times a day (QID) | ORAL | 0 refills | Status: DC | PRN

## 2020-06-05 MED ORDER — DOCUSATE SODIUM 100 MG PO CAPS: 100.0000 mg | ORAL_CAPSULE | Freq: Two times a day (BID) | ORAL | 1 refills | Status: DC

## 2020-06-05 NOTE — Progress Notes (Signed)
PROGRESS NOTE    Lisa Morton  LZJ:[id number] DOB: 14-May-1940 DOA: 06/04/2020 PCP: Maurice Small, MD   Brief Narrative: Lisa Morton is a 80 y.o. female with a history of hypertension, depression and anxiety. Patient presented secondary to left humerus fracture requiring surgical repair. Medical services consulted for medical management.   Assessment & Plan:   Principal Problem:   Closed fracture of left proximal humerus Active Problems:   Essential hypertension   Depression   Left humerus fracture Patient underwent reverse arthroplasty. Per orthopedic surgery  Pseudohyponatremia In setting of hyperglycemia. Repeat BMP shows resolution.  Hyperglycemia Unsure if this was lab error vs blood draw error. Seems unlikely to correlate to true hyperglycemia in this specific patient. Hemoglobin A1C from 3/21 of 5.3%. within context of all this information, likely lab error.  Primary hypertension Slightly soft blood pressure while on antihypertensives. Will recommend to resume lisinopril on discharge. Can discontinue hydrochlorothiazide on discharge.  Hypokalemia Treated with potassium chloride. Resolved.  Depression/Anxiety Continue home Zoloft and Wellbutrin   DVT prophylaxis: Per primary Code Status:   Code Status: Full Code Family Communication: None at bedside Disposition Plan: Per primary. Stable for discharge from medical service perspective   Antimicrobials:  None    Subjective: Pain is improved. Concerned about going home today.  Objective: Vitals:   06/05/20 0150 06/05/20 0617 06/05/20 0622 06/05/20 1005  BP: (!) 116/47 (!) 111/52 118/64 (!) 104/52  Pulse: 93 94 68 95  Resp: 16 16 18 15   Temp: 98.5 F (36.9 C) 98.5 F (36.9 C) 98.3 F (36.8 C) 97.6 F (36.4 C)  TempSrc: Oral Oral Oral Oral  SpO2: 99% 95% 99% 95%  Weight:      Height:        Intake/Output Summary (Last 24 hours) at 06/05/2020 1117 Last data filed at 06/05/2020 1018 Gross per 24  hour  Intake 940 ml  Output 1300 ml  Net -360 ml   Filed Weights   05/31/20 1603 06/04/20 0600  Weight: 61.7 kg 61.7 kg    Examination:  General exam: Appears calm and comfortable. Arm in sling Respiratory system: Clear to auscultation. Respiratory effort normal. Cardiovascular system: S1 & S2 heard, RRR. No murmurs, rubs, gallops or clicks. Gastrointestinal system: Abdomen is nondistended, soft and nontender. No organomegaly or masses felt. Normal bowel sounds heard. Central nervous system: Alert and oriented. No focal neurological deficits. Musculoskeletal: No edema. No calf tenderness Skin: No cyanosis. No rashes Psychiatry: Judgement and insight appear normal. Mood & affect appropriate.     Data Reviewed: I have personally reviewed following labs and imaging studies  CBC Lab Results  Component Value Date   WBC 10.5 06/05/2020   RBC 3.31 (L) 06/05/2020   HGB 10.3 (L) 06/05/2020   HCT 32.8 (L) 06/05/2020   MCV 99.1 06/05/2020   MCH 31.1 06/05/2020   PLT 195 06/05/2020   MCHC 31.4 06/05/2020   RDW 13.3 06/05/2020   LYMPHSABS 2.1 06/05/2020   MONOABS 1.5 (H) 06/05/2020   EOSABS 0.0 06/05/2020   BASOSABS 0.1 02/40/9735     Last metabolic panel Lab Results  Component Value Date   NA 136 06/05/2020   K 3.8 06/05/2020   CL 105 06/05/2020   CO2 23 06/05/2020   BUN 10 06/05/2020   CREATININE 0.57 06/05/2020   GLUCOSE 129 (H) 06/05/2020   GFRNONAA >60 06/05/2020   GFRAA >60 12/29/2017   CALCIUM 8.8 (L) 06/05/2020   PROT 5.2 (L) 06/05/2020   ALBUMIN 2.9 (L) 06/05/2020  BILITOT 0.6 06/05/2020   ALKPHOS 46 06/05/2020   AST 23 06/05/2020   ALT 18 06/05/2020   ANIONGAP 8 06/05/2020    CBG (last 3)  Recent Labs    06/05/20 0719  GLUCAP 138*     GFR: Estimated Creatinine Clearance: 45.7 mL/min (by C-G formula based on SCr of 0.57 mg/dL).  Coagulation Profile: No results for input(s): INR, PROTIME in the last 168 hours.  Recent Results (from the past  240 hour(s))  SARS CORONAVIRUS 2 (TAT 6-24 HRS) Nasopharyngeal Nasopharyngeal Swab     Status: None   Collection Time: 06/01/20 11:06 AM   Specimen: Nasopharyngeal Swab  Result Value Ref Range Status   SARS Coronavirus 2 NEGATIVE NEGATIVE Final    Comment: (NOTE) SARS-CoV-2 target nucleic acids are NOT DETECTED.  The SARS-CoV-2 RNA is generally detectable in upper and lower respiratory specimens during the acute phase of infection. Negative results do not preclude SARS-CoV-2 infection, do not rule out co-infections with other pathogens, and should not be used as the sole basis for treatment or other patient management decisions. Negative results must be combined with clinical observations, patient history, and epidemiological information. The expected result is Negative.  Fact Sheet for Patients: SugarRoll.be  Fact Sheet for Healthcare Providers: https://www.woods-mathews.com/  This test is not yet approved or cleared by the [place] FDA and  has been authorized for detection and/or diagnosis of SARS-CoV-2 by FDA under an Emergency Use Authorization (EUA). This EUA will remain  in effect (meaning this test can be used) for the duration of the COVID-19 declaration under Se ction 564(b)(1) of the Act, 21 U.S.C. section 360bbb-3(b)(1), unless the authorization is terminated or revoked sooner.  Performed at [facility], [place] [address], [place], [place] [address]         Radiology Studies: DG Shoulder 1V Left  Result Date: 06/04/2020 CLINICAL DATA:  Status post reverse shoulder arthroplasty EXAM: LEFT SHOULDER COMPARISON:  None. FINDINGS: Shoulder replacement is noted. No acute bony or soft tissue abnormality is seen. IMPRESSION: Status post left shoulder replacement.  No acute abnormality noted. Electronically Signed   By: Inez Catalina M.D.   On: 06/04/2020 11:18        Scheduled Meds: . buPROPion  150 mg Oral Daily   . cholecalciferol  2,000 Units Oral Daily  . clonazePAM  1 mg Oral QHS  . docusate sodium  100 mg Oral BID  . hydrochlorothiazide  12.5 mg Oral Daily  . lisinopril  5 mg Oral Daily  . multivitamin with minerals  1 tablet Oral Daily  . sertraline  100 mg Oral Daily   Continuous Infusions: . methocarbamol (ROBAXIN) IV       LOS: 0 days     Cordelia Poche, MD [facility] 06/05/2020, 11:17 AM  If 7PM-7AM, please contact night-coverage [email or web address]

## 2020-06-05 NOTE — Discharge Instructions (Signed)
Discharge Instructions  - Keep dressings in place for the next 5-7 days. You may shower over top of the current dressing. Once the surgical dressing has been removed, the incision may get wet as long as it is without drainage. Change dressing then daily with clean gauze and tape - Take all medication as prescribed. Transition to over the counter pain medication as your pain improves - Keep the shoulder elevated over the next 48-72 hours to help with pain and swelling - Ice the shoulder regularly to help with pain and swelling - Perform pendulum excercises to the shoulder 2-3 times a day. Be sure to move the elbow, wrist and fingers to prevent swelling and stiffness - Please call to schedule a follow up appointment with Dr. Jeannie Fend at [phone] for 10-14 days following surgery

## 2020-06-05 NOTE — Discharge Summary (Signed)
Patient ID: Lisa Morton MRN: [id number] DOB/AGE: [birth year] 80 y.o.  Admit date: 06/04/2020 Discharge date: 06/05/2020  Admission Diagnoses: Fracture left shoulder Past Medical History:  Diagnosis Date  . Depression   . GERD (gastroesophageal reflux disease)    Rare  . History of migraine   . History of syncope    Full negative cardiac workup  . Hyperlipemia   . Hypertension   . Numbness    Left heel, sharp itching pain  . OA (osteoarthritis)    lumbar spine, left hip  . Pre-diabetes   . Restless leg syndrome    on klonidipin    Discharge Diagnoses:  Principal Problem:   Closed fracture of left proximal humerus Active Problems:   Essential hypertension   Depression   Surgeries: Procedure(s): REVERSE SHOULDER ARTHROPLASTY on 06/04/2020    Consultants: Treatment Team:  Fatima Blank, MD Mariel Aloe, MD  Discharged Condition: Improved  Hospital Course: Lisa Morton is an 80 y.o. female who was admitted 06/04/2020 with a chief complaint of left shoulder pain from a left proximal humerus fracture, and found to have a diagnosis of Fracture left shoulder.  They were brought to the operating room on 06/04/2020 and underwent Procedure(s): [place].    They were given perioperative antibiotics:  Anti-infectives (From admission, onward)   Start     Dose/Rate Route Frequency Ordered Stop   06/04/20 1400  clindamycin (CLEOCIN) IVPB 600 mg        600 mg 100 mL/hr over 30 Minutes Intravenous Every 6 hours 06/04/20 1148 06/05/20 0234   06/04/20 0954  vancomycin (VANCOCIN) powder  Status:  Discontinued          As needed 06/04/20 0954 06/04/20 1131   06/04/20 0600  clindamycin (CLEOCIN) IVPB 900 mg        900 mg 100 mL/hr over 30 Minutes Intravenous On call to O.R. 06/04/20 8182 06/04/20 0754    .  They were given sequential compression devices, early ambulation for DVT prophylaxis.  Recent vital signs:  Patient Vitals for the past 24 hrs:  BP  Temp Temp src Pulse Resp SpO2  06/05/20 1005 (!) 104/52 97.6 F (36.4 C) Oral 95 15 95 %  06/05/20 0622 118/64 98.3 F (36.8 C) Oral 68 18 99 %  06/05/20 0617 (!) 111/52 98.5 F (36.9 C) Oral 94 16 95 %  06/05/20 0150 (!) 116/47 98.5 F (36.9 C) Oral 93 16 99 %  06/04/20 2116 (!) 110/54 97.8 F (36.6 C) Oral 92 18 92 %  06/04/20 1731 (!) 113/53 97.8 F (36.6 C) Oral 86 16 99 %  .  Recent laboratory studies: DG Shoulder 1V Left  Result Date: 06/04/2020 CLINICAL DATA:  Status post reverse shoulder arthroplasty EXAM: LEFT SHOULDER COMPARISON:  None. FINDINGS: Shoulder replacement is noted. No acute bony or soft tissue abnormality is seen. IMPRESSION: Status post left shoulder replacement.  No acute abnormality noted. Electronically Signed   By: Inez Catalina M.D.   On: 06/04/2020 11:18    Discharge Medications:   Allergies as of 06/05/2020      Reactions   Codeine Nausea And Vomiting   Latex Rash   Prolonged exposure to skin   Penicillins Rash      Medication List    STOP taking these medications   hydrochlorothiazide 25 MG tablet Commonly known as: HYDRODIURIL     TAKE these medications   buPROPion 150 MG 24 hr tablet Commonly known as: WELLBUTRIN XL Take 150 mg by  mouth daily.   CAL-MAG-ZINC PO Take 1 tablet by mouth daily.   CENTRUM SILVER ADULT 50+ PO Take 1 tablet by mouth daily.   clonazePAM 1 MG tablet Commonly known as: KLONOPIN Take 1 mg by mouth at bedtime.   docusate sodium 100 MG capsule Commonly known as: COLACE Take 1 capsule (100 mg total) by mouth 2 (two) times daily. Notes to patient: Stool softener   ESTER C PO Take 1 tablet by mouth daily.   HYDROcodone-acetaminophen 5-325 MG tablet Commonly known as: NORCO/VICODIN Take 1-2 tablets by mouth every 4 (four) hours as needed for moderate pain (pain score 4-6).   lisinopril 5 MG tablet Commonly known as: ZESTRIL Take 5 mg by mouth daily.   methocarbamol 500 MG tablet Commonly known as:  ROBAXIN Take 1 tablet (500 mg total) by mouth every 6 (six) hours as needed for muscle spasms.   OVER THE COUNTER MEDICATION Take 1 capsule by mouth 3 (three) times a week. Blood Builder- contains Vitamin C, Folate, Vitamin B12, and Iron. Takes on Wed and Sunday.   Red Yeast Rice 600 MG Caps Take 1,200 mg by mouth in the morning and at bedtime.   sertraline 100 MG tablet Commonly known as: ZOLOFT Take 100 mg by mouth daily.   vitamin B-12 1000 MCG tablet Commonly known as: CYANOCOBALAMIN Take 1,000 mcg by mouth 4 (four) times a week.   Vitamin D 50 MCG (2000 UT) Caps Take 2,000 Units by mouth daily.       Diagnostic Studies: DG Shoulder 1V Left  Result Date: 06/04/2020 CLINICAL DATA:  Status post reverse shoulder arthroplasty EXAM: LEFT SHOULDER COMPARISON:  None. FINDINGS: Shoulder replacement is noted. No acute bony or soft tissue abnormality is seen. IMPRESSION: Status post left shoulder replacement.  No acute abnormality noted. Electronically Signed   By: Inez Catalina M.D.   On: 06/04/2020 11:18    Patient did no have any complication post operatively following her reverse shoulder arthroplasty. She was cleared for discharge home from PT/OT and hospitalist. They benefited maximally from their hospital stay and there were no complications.     Disposition: Discharge disposition: 01-Home or Self Care      Discharge Instructions    Call MD / Call 911   Complete by: As directed    If you experience chest pain or shortness of breath, CALL 911 and be transported to the hospital emergency room.  If you develope a fever above 101 F, pus (white drainage) or increased drainage or redness at the wound, or calf pain, call your surgeon's office.   Constipation Prevention   Complete by: As directed    Drink plenty of fluids.  Prune juice may be helpful.  You may use a stool softener, such as Colace (over the counter) 100 mg twice a day.  Use MiraLax (over the counter) for  constipation as needed.   Diet - low sodium heart healthy   Complete by: As directed    Increase activity slowly as tolerated   Complete by: As directed       Follow-up Information    Avanell Shackleton III, MD. Schedule an appointment as soon as possible for a visit in 2 week(s).   Contact information: [address] [place] [address] [phone]                Signed: Verner Mould, MD  06/05/2020, 3:53 PM

## 2020-06-05 NOTE — Progress Notes (Signed)
RN reviewed discharge instructions with patient and family. All questions answered.   Paperwork given. Prescriptions electronically sent to patient pharmacy.   Patient given private duty agency list per patient and family request.    NT rolled patient down with all belongings to family car.    Benedetto Goad, RN

## 2020-06-05 NOTE — Progress Notes (Signed)
Physical Therapy Treatment Patient Details Name: Lisa Morton MRN: [id number] DOB: 07-28-1940 Today's Date: 06/05/2020    History of Present Illness 80 y.o. female s/p Lt TSA (reverse)  on 06/04/2020 following a communiated and displaced humeral fracture from a fall withP MH significant for OA, HTN, HLD, syncope, depression, and GERD.    PT Comments    Pt was OOB dressed and in recliner.  Assisted with amb to bathroom.  General transfer comment: A for safety present with slight unsteadiness esp with turns.  Also assisted to bathroom. General Gait Details: first amb to bathroom at [place] for safety then in hallway a functional distance.  slow gait.  Caution with turns and navigation around obsticles.   Pt plans to return home.   Follow Up Recommendations  Home health PT     Equipment Recommendations  None recommended by PT    Recommendations for Other Services       Precautions / Restrictions Precautions Precautions: Fall;Shoulder Type of Shoulder Precautions: AROM elbow, wrist and hand ok, per operative note okay for gentle pendulums, no A/PROM at shoulder Shoulder Interventions: Shoulder sling/immobilizer;Off for dressing/bathing/exercises Precaution Booklet Issued: Yes (comment) Required Braces or Orthoses: Sling Restrictions Weight Bearing Restrictions: Yes LUE Weight Bearing: Non weight bearing    Mobility  Bed Mobility               General bed mobility comments: OOB in recliner    Transfers Overall transfer level: Needs assistance Equipment used: Rolling walker (2 wheeled) Transfers: Sit to/from [facility] Sit to Stand: Supervision;Min guard Stand pivot transfers: Min guard;Min assist       General transfer comment: A for safety present with slight unsteadiness esp with turns.  Also assisted to bathroom.  Ambulation/Gait Ambulation/Gait assistance: Min guard;Min assist Gait Distance (Feet): 85 Feet Assistive device: Straight  cane Gait Pattern/deviations: Step-through pattern;Drifts right/left Gait velocity: decreased   General Gait Details: first amb to bathroom at [place] for safety then in hallway a functional distance.  slow gait.  Caution with turns and navigation around obsticles.   Stairs             Wheelchair Mobility    Modified Rankin (Stroke Patients Only)       Balance Overall balance assessment: Needs assistance Sitting-balance support: Feet supported Sitting balance-Leahy Scale: Fair     Standing balance support: No upper extremity supported Standing balance-Leahy Scale: Fair                              Cognition Arousal/Alertness: Awake/alert Behavior During Therapy: WFL for tasks assessed/performed Overall Cognitive Status: Within Functional Limits for tasks assessed                                 General Comments: AxO x 3 very pleasant      Exercises Donning/doffing shirt without moving shoulder: Moderate assistance;Patient able to independently direct caregiver Method for sponge bathing under operated UE: Minimal assistance;Patient able to independently direct caregiver Donning/doffing sling/immobilizer: Moderate assistance;Patient able to independently direct caregiver Correct positioning of sling/immobilizer: Minimal assistance;Patient able to independently direct caregiver Pendulum exercises (written home exercise program): Patient able to independently direct caregiver ROM for elbow, wrist and digits of operated UE: Patient able to independently direct caregiver Sling wearing schedule (on at all times/off for ADL's): Patient able to independently direct caregiver Proper positioning of  operated UE when showering: Patient able to independently direct caregiver Positioning of UE while sleeping: Patient able to independently direct caregiver    General Comments General comments (skin integrity, edema, etc.): patient BP after  standing ADLs, 108/57 RN aware. patient reporting mild dizziness      Pertinent Vitals/Pain Pain Assessment: 0-10 Faces Pain Scale: Hurts a little bit Pain Location: L shoulder Pain Descriptors / Indicators: Guarding;Grimacing Pain Intervention(s): Monitored during session    Home Living Family/patient expects to be discharged to:: Private residence Living Arrangements: Alone Available Help at Discharge: Neighbor;Family Type of Home: House Home Access: Stairs to enter Entrance Stairs-Rails: Right;Left Home Layout: One level Home Equipment: Cane - quad;Cane - single point;Bedside commode Additional Comments: Pt will have intermittent assist from neighbors, daughters, and son- in laws who live close by. She can have some daytime and intermittent over night supervision but not 24/7. Pt reports that her daughter does her grocery shopping and that she does not drive anymore.    Prior Function Level of Independence: Independent with assistive device(s)      Comments: use of cane at home. Pt reports history of falls   PT Goals (current goals can now be found in the care plan section) Acute Rehab PT Goals Patient Stated Goal: regain independence Progress towards PT goals: Progressing toward goals    Frequency    7X/week      PT Plan Current plan remains appropriate    Co-evaluation              AM-PAC PT "6 Clicks" Mobility   Outcome Measure  Help needed turning from your back to your side while in a flat bed without using bedrails?: A Little Help needed moving from lying on your back to sitting on the side of a flat bed without using bedrails?: A Little Help needed moving to and from a bed to a chair (including a wheelchair)?: A Little Help needed standing up from a chair using your arms (e.g., wheelchair or bedside chair)?: A Little Help needed to walk in hospital room?: A Little Help needed climbing 3-5 steps with a railing? : A Lot 6 Click Score: 17    End of  Session Equipment Utilized During Treatment: Gait belt Activity Tolerance: Patient tolerated treatment well Patient left: in chair;with call bell/phone within reach;with chair alarm set Nurse Communication: Mobility status PT Visit Diagnosis: Unsteadiness on feet (R26.81);History of falling (Z91.81)     Time: 1137-1208 PT Time Calculation (min) (ACUTE ONLY): 31 min  Charges:  $Gait Training: 8-22 mins $Therapeutic Activity: 8-22 mins                     Rica Koyanagi  PTA Acute  Rehabilitation Services Pager      [phone] Office      [phone]

## 2020-06-05 NOTE — Evaluation (Signed)
Occupational Therapy Evaluation Patient Details Name: Lisa Morton MRN: [id number] DOB: 04/07/1940 Today's Date: 06/05/2020    History of Present Illness 80 y.o. female s/p Lt TSA (reverse)  on 06/04/2020 following a communiated and displaced humeral fracture from a fall withP MH significant for OA, HTN, HLD, syncope, depression, and GERD.   Clinical Impression   s/p shoulder replacement without functional use of left non-dominant upper extremity secondary to effects of surgery and interscalene block and shoulder precautions. Therapist provided education and instruction to patient in regards to exercises, precautions, positioning, donning upper extremity clothing and bathing while maintaining shoulder precautions, ice and edema management and donning/doffing sling. Patient verbalized understanding and demonstrated as needed. Patient needed assistance to donn shirt, underwear, pants, socks and shoes and provided with instruction on compensatory strategies to perform ADLs. Spoke with patient if she would be able to arrange 24/7 support at home at least initially after surgery due to current unsteadiness, limited L UE use and history of recent falls. Also informed RN who states she will talk with patient's children about this recommendation. Patient to follow up with MD for further therapy needs.      Follow Up Recommendations  Home health OT;Supervision/Assistance - 24 hour;Follow surgeon's recommendation for DC plan and follow-up therapies;Other (comment) (24/7 at least initially)    Equipment Recommendations  None recommended by OT       Precautions / Restrictions Precautions Precautions: Fall;Shoulder Type of Shoulder Precautions: AROM elbow, wrist and hand ok, per operative note okay for gentle pendulums, no A/PROM at shoulder Shoulder Interventions: Shoulder sling/immobilizer;Off for dressing/bathing/exercises Precaution Booklet Issued: Yes (comment) Required Braces or Orthoses:  Sling Restrictions Weight Bearing Restrictions: Yes LUE Weight Bearing: Non weight bearing      Mobility Bed Mobility               General bed mobility comments: OOB in chair, educated patient on safe way of getting in/out of bed to maintain shoulder precautions    Transfers Overall transfer level: Needs assistance Equipment used: None Transfers: Sit to/from Stand Sit to Stand: Min assist         General transfer comment: min A for steadying does appear to have R lateral lean    Balance Overall balance assessment: Needs assistance Sitting-balance support: Feet supported Sitting balance-Leahy Scale: Fair     Standing balance support: No upper extremity supported Standing balance-Leahy Scale: Fair                             ADL either performed or assessed with clinical judgement   ADL Overall ADL's : Needs assistance/impaired Eating/Feeding: Set up;Sitting   Grooming: Set up;Sitting   Upper Body Bathing: Minimal assistance;Sitting   Lower Body Bathing: Minimal assistance;Sitting/lateral leans;Sit to/from stand   Upper Body Dressing : Moderate assistance;Sitting;Cueing for UE precautions;Cueing for sequencing Upper Body Dressing Details (indicate cue type and reason): difficulty following cues for threading L UE first Lower Body Dressing: Minimal assistance;Sitting/lateral leans;Sit to/from stand Lower Body Dressing Details (indicate cue type and reason): significantly increased time as well as cues to problem solve donning underwear and pants, min A for balance and to pull up over L hip Toilet Transfer: Minimal assistance Toilet Transfer Details (indicate cue type and reason): sit to stand from chair, patient is tremulous at times with mild loss of balance, min A for safety Toileting- Clothing Manipulation and Hygiene: Minimal assistance;Sit to/from stand       Functional  mobility during ADLs: Minimal assistance General ADL Comments: patient  educated in compensatory strategies for ADL tasks in order to maintain shoulder precautions                  Pertinent Vitals/Pain Pain Assessment: 0-10 Faces Pain Scale: Hurts a little bit Pain Location: L shoulder Pain Descriptors / Indicators: Guarding;Grimacing Pain Intervention(s): Monitored during session     Hand Dominance Right   Extremity/Trunk Assessment Upper Extremity Assessment Upper Extremity Assessment: LUE deficits/detail LUE Deficits / Details: intact hand/wrist ROM, difficulty assessing elbow due to pain LUE: Unable to fully assess due to pain;Unable to fully assess due to immobilization   Lower Extremity Assessment Lower Extremity Assessment: Defer to PT evaluation       Communication Communication Communication: No difficulties   Cognition Arousal/Alertness: Awake/alert Behavior During Therapy: WFL for tasks assessed/performed Overall Cognitive Status: Within Functional Limits for tasks assessed                                 General Comments: AxO x 3 very pleasant   General Comments  patient BP after standing ADLs, 108/57 RN aware. patient reporting mild dizziness    Exercises Exercises: Shoulder   Shoulder Instructions Shoulder Instructions Donning/doffing shirt without moving shoulder: Moderate assistance;Patient able to independently direct caregiver Method for sponge bathing under operated UE: Minimal assistance;Patient able to independently direct caregiver Donning/doffing sling/immobilizer: Moderate assistance;Patient able to independently direct caregiver Correct positioning of sling/immobilizer: Minimal assistance;Patient able to independently direct caregiver Pendulum exercises (written home exercise program): Patient able to independently direct caregiver ROM for elbow, wrist and digits of operated UE: Patient able to independently direct caregiver Sling wearing schedule (on at all times/off for ADL's): Patient able to  independently direct caregiver Proper positioning of operated UE when showering: Patient able to independently direct caregiver Positioning of UE while sleeping: Patient able to independently direct caregiver    Home Living Family/patient expects to be discharged to:: Private residence Living Arrangements: Alone Available Help at Discharge: Neighbor;Family Type of Home: House Home Access: Stairs to enter [employer] of Steps: 5 Entrance Stairs-Rails: Right;Left Home Layout: One level     Bathroom Shower/Tub: Teacher, early years/pre: Handicapped height Bathroom Accessibility: Yes   Home Equipment: [place] - quad;Cane - single point;Bedside commode   Additional Comments: Pt will have intermittent assist from neighbors, daughters, and son- in laws who live close by. She can have some daytime and intermittent over night supervision but not 24/7. Pt reports that her daughter does her grocery shopping and that she does not drive anymore.      Prior Functioning/Environment Level of Independence: Independent with assistive device(s)        Comments: use of cane at home. Pt reports history of falls        OT Problem List: Decreased strength;Decreased activity tolerance;Impaired balance (sitting and/or standing);Decreased safety awareness;Decreased knowledge of precautions;Pain;Impaired UE functional use      OT Treatment/Interventions: Self-care/ADL training;Therapeutic exercise;Therapeutic activities;Patient/family education;Balance training    OT Goals(Current goals can be found in the care plan section) Acute Rehab OT Goals Patient Stated Goal: regain independence OT Goal Formulation: With patient Time For Goal Achievement: 06/19/20 Potential to Achieve Goals: Good  OT Frequency: Min 2X/week    AM-PAC OT "6 Clicks" Daily Activity     Outcome Measure Help from another person eating meals?: A Little Help from another person taking care of  personal  grooming?: A Little Help from another person toileting, which includes using toliet, bedpan, or urinal?: A Little Help from another person bathing (including washing, rinsing, drying)?: A Little Help from another person to put on and taking off regular upper body clothing?: A Lot Help from another person to put on and taking off regular lower body clothing?: A Little 6 Click Score: 17   End of Session Equipment Utilized During Treatment: Other (comment) (sling) Nurse Communication: Mobility status  Activity Tolerance: Patient tolerated treatment well Patient left: in chair;with call bell/phone within reach;with chair alarm set;with nursing/sitter in room  OT Visit Diagnosis: Unsteadiness on feet (R26.81);Pain;History of falling (Z91.81) Pain - Right/Left: Left Pain - part of body: Shoulder                Time: 4643-1427 OT Time Calculation (min): 53 min Charges:  OT General Charges $OT Visit: 1 Visit OT Evaluation $OT Eval Low Complexity: 1 Low OT Treatments $Self Care/Home Management : 38-52 mins  Delbert Phenix OT OT pager: [place] 06/05/2020, 12:41 PM

## 2020-06-05 NOTE — Progress Notes (Signed)
   Ortho Hand Progress Note  Subjective: No acute events last night. Pain in left arm well controlled.   Objective: Vital signs in last 24 hours: Temp:  [97.5 F (36.4 C)-98.5 F (36.9 C)] 98.3 F (36.8 C) (03/22 0622) Pulse Rate:  [68-102] 68 (03/22 0622) Resp:  [16-18] 18 (03/22 0622) BP: (104-118)/(47-64) 118/64 (03/22 0622) SpO2:  [92 %-100 %] 99 % (03/22 0622)  Intake/Output from previous day: 03/21 0701 - 03/22 0700 In: 700 [P.O.:600; IV Piggyback:100] Out: 1500 [Urine:1300; Blood:200] Intake/Output this shift: No intake/output data recorded.  Recent Labs    06/04/20 0606 06/05/20 0238  HGB 13.4 10.3*   Recent Labs    06/04/20 0606 06/05/20 0238  WBC 6.8 10.5  RBC 4.37 3.31*  HCT 42.1 32.8*  PLT 270 195   Recent Labs    06/04/20 0606 06/05/20 0238  NA 138 127*  K 3.4* 3.5  CL 105 96*  CO2 23 20*  BUN 13 10  CREATININE 0.57 0.55  GLUCOSE 111* 493*  CALCIUM 9.5 7.9*   No results for input(s): LABPT, INR in the last 72 hours.  Aaox3 nad Resp nonlabored RRR LUE: sling in place. Dressings clean and dry. Mild swelling to the shoulder. Intact motor to the AIN/PIN/U. SILT m/u/r/a. Fingers wwp with bcr.   Assessment/Plan: Left shoulder reverse arthroplasty for fracture POD1  -Hospitalist consulted. Appreciate co-management -NWB LUE. Sling at all times - OT for elbow ROM and pendulums - Regular diet - post op abx - PO pain meds PRN  D/c home later today pending medical clearance.   Avanell Shackleton III 06/05/2020, 7:32 AM  [phone]

## 2020-06-19 DIAGNOSIS — M25512 Pain in left shoulder: Secondary | ICD-10-CM | POA: Diagnosis not present

## 2020-06-22 ENCOUNTER — Inpatient Hospital Stay (HOSPITAL_COMMUNITY)
Admission: EM | Admit: 2020-06-22 | Discharge: 2020-06-26 | DRG: 872 | Disposition: A | Discharge status: Home Health | Payer: Medicare Other | Attending: Family Medicine | Admitting: Family Medicine

## 2020-06-22 ENCOUNTER — Emergency Department (HOSPITAL_COMMUNITY): Payer: Medicare Other

## 2020-06-22 ENCOUNTER — Encounter (HOSPITAL_COMMUNITY): Payer: Self-pay

## 2020-06-22 ENCOUNTER — Other Ambulatory Visit: Payer: Self-pay

## 2020-06-22 DIAGNOSIS — K529 Noninfective gastroenteritis and colitis, unspecified: Secondary | ICD-10-CM | POA: Diagnosis not present

## 2020-06-22 DIAGNOSIS — F418 Other specified anxiety disorders: Secondary | ICD-10-CM | POA: Diagnosis present

## 2020-06-22 DIAGNOSIS — R6510 Systemic inflammatory response syndrome (SIRS) of non-infectious origin without acute organ dysfunction: Secondary | ICD-10-CM | POA: Diagnosis not present

## 2020-06-22 DIAGNOSIS — Z20822 Contact with and (suspected) exposure to covid-19: Secondary | ICD-10-CM | POA: Diagnosis present

## 2020-06-22 DIAGNOSIS — E785 Hyperlipidemia, unspecified: Secondary | ICD-10-CM | POA: Diagnosis not present

## 2020-06-22 DIAGNOSIS — R933 Abnormal findings on diagnostic imaging of other parts of digestive tract: Secondary | ICD-10-CM

## 2020-06-22 DIAGNOSIS — K388 Other specified diseases of appendix: Secondary | ICD-10-CM | POA: Diagnosis not present

## 2020-06-22 DIAGNOSIS — R531 Weakness: Secondary | ICD-10-CM | POA: Diagnosis not present

## 2020-06-22 DIAGNOSIS — I5032 Chronic diastolic (congestive) heart failure: Secondary | ICD-10-CM | POA: Diagnosis not present

## 2020-06-22 DIAGNOSIS — Z96612 Presence of left artificial shoulder joint: Secondary | ICD-10-CM | POA: Diagnosis not present

## 2020-06-22 DIAGNOSIS — K8020 Calculus of gallbladder without cholecystitis without obstruction: Secondary | ICD-10-CM | POA: Diagnosis not present

## 2020-06-22 DIAGNOSIS — E876 Hypokalemia: Secondary | ICD-10-CM | POA: Diagnosis present

## 2020-06-22 DIAGNOSIS — F32A Depression, unspecified: Secondary | ICD-10-CM | POA: Diagnosis not present

## 2020-06-22 DIAGNOSIS — Z885 Allergy status to narcotic agent status: Secondary | ICD-10-CM

## 2020-06-22 DIAGNOSIS — M25519 Pain in unspecified shoulder: Secondary | ICD-10-CM | POA: Diagnosis not present

## 2020-06-22 DIAGNOSIS — K219 Gastro-esophageal reflux disease without esophagitis: Secondary | ICD-10-CM | POA: Diagnosis not present

## 2020-06-22 DIAGNOSIS — I10 Essential (primary) hypertension: Secondary | ICD-10-CM | POA: Diagnosis not present

## 2020-06-22 DIAGNOSIS — R7303 Prediabetes: Secondary | ICD-10-CM | POA: Diagnosis not present

## 2020-06-22 DIAGNOSIS — K828 Other specified diseases of gallbladder: Secondary | ICD-10-CM | POA: Diagnosis present

## 2020-06-22 DIAGNOSIS — Z8249 Family history of ischemic heart disease and other diseases of the circulatory system: Secondary | ICD-10-CM

## 2020-06-22 DIAGNOSIS — K921 Melena: Secondary | ICD-10-CM | POA: Diagnosis not present

## 2020-06-22 DIAGNOSIS — A419 Sepsis, unspecified organism: Secondary | ICD-10-CM | POA: Diagnosis not present

## 2020-06-22 DIAGNOSIS — E8809 Other disorders of plasma-protein metabolism, not elsewhere classified: Secondary | ICD-10-CM | POA: Diagnosis present

## 2020-06-22 DIAGNOSIS — K838 Other specified diseases of biliary tract: Secondary | ICD-10-CM | POA: Diagnosis not present

## 2020-06-22 DIAGNOSIS — D1803 Hemangioma of intra-abdominal structures: Secondary | ICD-10-CM | POA: Diagnosis not present

## 2020-06-22 DIAGNOSIS — D62 Acute posthemorrhagic anemia: Secondary | ICD-10-CM | POA: Diagnosis not present

## 2020-06-22 DIAGNOSIS — Z743 Need for continuous supervision: Secondary | ICD-10-CM | POA: Diagnosis not present

## 2020-06-22 DIAGNOSIS — F419 Anxiety disorder, unspecified: Secondary | ICD-10-CM | POA: Diagnosis not present

## 2020-06-22 DIAGNOSIS — R0902 Hypoxemia: Secondary | ICD-10-CM | POA: Diagnosis not present

## 2020-06-22 DIAGNOSIS — Z79899 Other long term (current) drug therapy: Secondary | ICD-10-CM | POA: Diagnosis not present

## 2020-06-22 DIAGNOSIS — Z66 Do not resuscitate: Secondary | ICD-10-CM | POA: Diagnosis present

## 2020-06-22 DIAGNOSIS — R935 Abnormal findings on diagnostic imaging of other abdominal regions, including retroperitoneum: Secondary | ICD-10-CM

## 2020-06-22 DIAGNOSIS — I110 Hypertensive heart disease with heart failure: Secondary | ICD-10-CM | POA: Diagnosis not present

## 2020-06-22 DIAGNOSIS — K839 Disease of biliary tract, unspecified: Secondary | ICD-10-CM | POA: Diagnosis not present

## 2020-06-22 DIAGNOSIS — Z91040 Latex allergy status: Secondary | ICD-10-CM | POA: Diagnosis not present

## 2020-06-22 DIAGNOSIS — N281 Cyst of kidney, acquired: Secondary | ICD-10-CM | POA: Diagnosis not present

## 2020-06-22 DIAGNOSIS — K6389 Other specified diseases of intestine: Secondary | ICD-10-CM | POA: Diagnosis not present

## 2020-06-22 DIAGNOSIS — A09 Infectious gastroenteritis and colitis, unspecified: Secondary | ICD-10-CM | POA: Diagnosis not present

## 2020-06-22 DIAGNOSIS — R197 Diarrhea, unspecified: Secondary | ICD-10-CM | POA: Diagnosis not present

## 2020-06-22 LAB — CBC WITH DIFFERENTIAL/PLATELET
Abs Immature Granulocytes: 0.13 10*3/uL — ABNORMAL HIGH (ref 0.00–0.07)
Basophils Absolute: 0.1 10*3/uL (ref 0.0–0.1)
Basophils Relative: 1 %
Eosinophils Absolute: 0 10*3/uL (ref 0.0–0.5)
Eosinophils Relative: 0 %
HCT: 35.8 % — ABNORMAL LOW (ref 36.0–46.0)
Hemoglobin: 11.3 g/dL — ABNORMAL LOW (ref 12.0–15.0)
Immature Granulocytes: 1 %
Lymphocytes Relative: 6 %
Lymphs Abs: 1 10*3/uL (ref 0.7–4.0)
MCH: 30.3 pg (ref 26.0–34.0)
MCHC: 31.6 g/dL (ref 30.0–36.0)
MCV: 96 fL (ref 80.0–100.0)
Monocytes Absolute: 1.3 10*3/uL — ABNORMAL HIGH (ref 0.1–1.0)
Monocytes Relative: 7 %
Neutro Abs: 15 10*3/uL — ABNORMAL HIGH (ref 1.7–7.7)
Neutrophils Relative %: 85 %
Platelets: 330 10*3/uL (ref 150–400)
RBC: 3.73 MIL/uL — ABNORMAL LOW (ref 3.87–5.11)
RDW: 13.9 % (ref 11.5–15.5)
WBC: 17.6 10*3/uL — ABNORMAL HIGH (ref 4.0–10.5)
nRBC: 0 % (ref 0.0–0.2)

## 2020-06-22 LAB — COMPREHENSIVE METABOLIC PANEL
ALT: 17 U/L (ref 0–44)
AST: 30 U/L (ref 15–41)
Albumin: 3.3 g/dL — ABNORMAL LOW (ref 3.5–5.0)
Alkaline Phosphatase: 82 U/L (ref 38–126)
Anion gap: 9 (ref 5–15)
BUN: 14 mg/dL (ref 8–23)
CO2: 24 mmol/L (ref 22–32)
Calcium: 8.6 mg/dL — ABNORMAL LOW (ref 8.9–10.3)
Chloride: 100 mmol/L (ref 98–111)
Creatinine, Ser: 0.58 mg/dL (ref 0.44–1.00)
GFR, Estimated: >60 mL/min (ref >60)
Glucose, Bld: 132 mg/dL — ABNORMAL HIGH (ref 70–99)
Potassium: 4 mmol/L (ref 3.5–5.1)
Sodium: 133 mmol/L — ABNORMAL LOW (ref 135–145)
Total Bilirubin: 1 mg/dL (ref 0.3–1.2)
Total Protein: 6.6 g/dL (ref 6.5–8.1)

## 2020-06-22 LAB — RESP PANEL BY RT-PCR (FLU A&B, COVID) ARPGX2
Influenza A by PCR: NEGATIVE
Influenza B by PCR: NEGATIVE
SARS Coronavirus 2 by RT PCR: NEGATIVE

## 2020-06-22 LAB — URINALYSIS, ROUTINE W REFLEX MICROSCOPIC
Bacteria, UA: NONE SEEN
Bilirubin Urine: NEGATIVE
Glucose, UA: NEGATIVE mg/dL
Ketones, ur: NEGATIVE mg/dL
Leukocytes,Ua: NEGATIVE
Nitrite: NEGATIVE
Protein, ur: NEGATIVE mg/dL
Specific Gravity, Urine: 1.03 (ref 1.005–1.030)
pH: 7 (ref 5.0–8.0)

## 2020-06-22 LAB — LIPASE, BLOOD: Lipase: 29 U/L (ref 11–51)

## 2020-06-22 MED ORDER — IOHEXOL 300 MG/ML  SOLN
100.0000 mL | Freq: Once | INTRAMUSCULAR | Status: AC | PRN
  Administered 2020-06-22: 100 mL via INTRAVENOUS

## 2020-06-22 MED ORDER — METRONIDAZOLE 500 MG PO TABS: 500.0000 mg | ORAL_TABLET | Freq: Two times a day (BID) | ORAL | 0 refills | Status: DC

## 2020-06-22 MED ORDER — CIPROFLOXACIN HCL 500 MG PO TABS: 500.0000 mg | ORAL_TABLET | Freq: Two times a day (BID) | ORAL | 0 refills | Status: DC

## 2020-06-22 MED ORDER — SODIUM CHLORIDE 0.9 % IV BOLUS
500.0000 mL | Freq: Once | INTRAVENOUS | Status: AC
  Administered 2020-06-22: 500 mL via INTRAVENOUS

## 2020-06-22 MED ORDER — SODIUM CHLORIDE 0.9 % IV SOLN: INTRAVENOUS | Status: DC

## 2020-06-22 MED ORDER — ONDANSETRON HCL 4 MG/2ML IJ SOLN
4.0000 mg | Freq: Once | INTRAMUSCULAR | Status: AC
  Administered 2020-06-22: 4 mg via INTRAVENOUS
  Filled 2020-06-22: qty 2

## 2020-06-22 MED ORDER — ONDANSETRON 4 MG PO TBDP: 4.0000 mg | ORAL_TABLET | Freq: Three times a day (TID) | ORAL | 0 refills | Status: DC | PRN

## 2020-06-22 MED ORDER — CIPROFLOXACIN IN D5W 400 MG/200ML IV SOLN
400.0000 mg | Freq: Once | INTRAVENOUS | Status: AC
  Administered 2020-06-22: 400 mg via INTRAVENOUS
  Filled 2020-06-22: qty 200

## 2020-06-22 MED ORDER — METRONIDAZOLE IN NACL 5-0.79 MG/ML-% IV SOLN
500.0000 mg | Freq: Once | INTRAVENOUS | Status: AC
  Administered 2020-06-22: 500 mg via INTRAVENOUS
  Filled 2020-06-22: qty 100

## 2020-06-22 NOTE — ED Triage Notes (Signed)
Pt presents  Via EMS from home with a one day hx of diarrhea and weakness. Past hx of left shoulder surgery around two weeks ago following a fall.

## 2020-06-22 NOTE — ED Provider Notes (Addendum)
[facility] DEPT Provider Note   CSN: [id number] Arrival date & time: 06/22/20  1636     History Chief Complaint  Patient presents with  . Weakness    Weakness and diarrhea x1 day.     Lisa Morton is a 80 y.o. female.  HPI Patient presents the behest of her daughters due to weakness. Patient has notable history of left shoulder surgery 3 weeks ago.  She notes that she did not go to a rehabilitation facility, has been home, attempting to go to physical therapy sessions, recovering relatively uneventfully until today.  She notes that today she developed diffuse weakness, including inability to ambulate, whereas she was doing so recently.  She also developed right lower quadrant abdominal pain, has had multiple episodes of loose stool with difficulty controlling it.  Pain is currently focal, but was previously diffuse, sore.  No reported fever, no vomiting, no chest pain.  No clear precipitating, alleviating, exacerbating factors.    Past Medical History:  Diagnosis Date  . Depression   . GERD (gastroesophageal reflux disease)    Rare  . History of migraine   . History of syncope    Full negative cardiac workup  . Hyperlipemia   . Hypertension   . Numbness    Left heel, sharp itching pain  . OA (osteoarthritis)    lumbar spine, left hip  . Pre-diabetes   . Restless leg syndrome    on klonidipin    Patient Active Problem List   Diagnosis Date Noted  . Depression 06/05/2020  . Closed fracture of left proximal humerus 06/04/2020  . Syncope 05/05/2018  . Essential hypertension 05/05/2018  . Cystocele 01/27/2012    Past Surgical History:  Procedure Laterality Date  . ABDOMINAL HYSTERECTOMY  1984  . CATARACT EXTRACTION  07/2011  . CESAREAN SECTION     x2  . COLONOSCOPY    . CYSTOCELE REPAIR  01/26/2012   Procedure: ANTERIOR REPAIR (CYSTOCELE);  Surgeon: Thurnell Lose, MD;  Location: [facility];  Service: Gynecology;  Laterality: N/A;  .  REVERSE SHOULDER ARTHROPLASTY Left 06/04/2020   Procedure: REVERSE SHOULDER ARTHROPLASTY;  Surgeon: Verner Mould, MD;  Location: [facility];  Service: Orthopedics;  Laterality: Left;  with block     OB History   No obstetric history on file.     Family History  Problem Relation Age of Onset  . Cancer Mother   . Heart attack Father        No details died at age 59  . Bipolar disorder Sister   . Breast cancer Maternal Grandmother     Social History   Tobacco Use  . Smoking status: Never Smoker  . Smokeless tobacco: Never Used  Vaping Use  . Vaping Use: Never used  Substance Use Topics  . Alcohol use: No  . Drug use: No    Home Medications Prior to Admission medications   Medication Sig Start Date End Date Taking? Authorizing Provider  Bioflavonoid Products (ESTER C PO) Take 1 tablet by mouth daily.    [provider]  buPROPion (WELLBUTRIN XL) 150 MG 24 hr tablet Take 150 mg by mouth daily.    [provider]  Calcium-Magnesium-Zinc (CAL-MAG-ZINC PO) Take 1 tablet by mouth daily.    [provider]  Cholecalciferol (VITAMIN D) 50 MCG (2000 UT) CAPS Take 2,000 Units by mouth daily.    [provider]  clonazePAM (KLONOPIN) 1 MG tablet Take 1 mg by mouth at bedtime.  [provider]  docusate sodium (COLACE) 100 MG capsule Take 1 capsule (100 mg total) by mouth 2 (two) times daily. 06/05/20   Verner Mould, MD  HYDROcodone-acetaminophen (NORCO/VICODIN) 5-325 MG tablet Take 1-2 tablets by mouth every 4 (four) hours as needed for moderate pain (pain score 4-6). 06/05/20   Avanell Shackleton III, MD  lisinopril (PRINIVIL,ZESTRIL) 5 MG tablet Take 5 mg by mouth daily.    [provider]  methocarbamol (ROBAXIN) 500 MG tablet Take 1 tablet (500 mg total) by mouth every 6 (six) hours as needed for muscle spasms. 06/05/20   Verner Mould, MD  Multiple Vitamins-Minerals (CENTRUM SILVER ADULT 50+ PO) Take 1  tablet by mouth daily.    [provider]  OVER THE COUNTER MEDICATION Take 1 capsule by mouth 3 (three) times a week. Blood Builder- contains Vitamin C, Folate, Vitamin B12, and Iron. Takes on Wed and Sunday.    [provider]  Red Yeast Rice 600 MG CAPS Take 1,200 mg by mouth in the morning and at bedtime.    [provider]  sertraline (ZOLOFT) 100 MG tablet Take 100 mg by mouth daily. 06/30/16   [provider]  vitamin B-12 (CYANOCOBALAMIN) 1000 MCG tablet Take 1,000 mcg by mouth 4 (four) times a week.     [provider]    Allergies    Codeine, Latex, and Penicillins  Review of Systems   Review of Systems  Constitutional:       Per HPI, otherwise negative  HENT:       Per HPI, otherwise negative  Respiratory:       Per HPI, otherwise negative  Cardiovascular:       Per HPI, otherwise negative  Gastrointestinal: Positive for abdominal pain, diarrhea and nausea. Negative for vomiting.  Endocrine:       Negative aside from HPI  Genitourinary:       Neg aside from HPI   Musculoskeletal:       Per HPI, otherwise negative  Skin: Negative.   Neurological: Positive for weakness. Negative for syncope.    Physical Exam Updated Vital Signs BP 118/65 (BP Location: Right Arm)   Pulse 93   Temp 97.8 F (36.6 C) (Oral)   Resp 19   SpO2 99%   Physical Exam Vitals and nursing note reviewed.  Constitutional:      General: She is not in acute distress.    Appearance: She is well-developed.  HENT:     Head: Normocephalic and atraumatic.  Eyes:     Conjunctiva/sclera: Conjunctivae normal.  Cardiovascular:     Rate and Rhythm: Normal rate and regular rhythm.  Pulmonary:     Effort: Pulmonary effort is normal. No respiratory distress.     Breath sounds: Normal breath sounds. No stridor.  Abdominal:     General: There is no distension.     Tenderness: There is abdominal tenderness.  Musculoskeletal:     Comments: Left shoulder in  sling, patient denies acute changes.  Skin:    General: Skin is warm and dry.  Neurological:     Mental Status: She is alert and oriented to person, place, and time.     Cranial Nerves: No cranial nerve deficit.     ED Results / Procedures / Treatments   Labs (all labs ordered are listed, but only abnormal results are displayed) Labs Reviewed  COMPREHENSIVE METABOLIC PANEL - Abnormal; Notable for the following components:  Result Value   Sodium 133 (*)    Glucose, Bld 132 (*)    Calcium 8.6 (*)    Albumin 3.3 (*)    All other components within normal limits  CBC WITH DIFFERENTIAL/PLATELET - Abnormal; Notable for the following components:   WBC 17.6 (*)    RBC 3.73 (*)    Hemoglobin 11.3 (*)    HCT 35.8 (*)    Neutro Abs 15.0 (*)    Monocytes Absolute 1.3 (*)    Abs Immature Granulocytes 0.13 (*)    All other components within normal limits  RESP PANEL BY RT-PCR (FLU A&B, COVID) ARPGX2  SARS CORONAVIRUS 2 (TAT 6-24 HRS)  LIPASE, BLOOD  URINALYSIS, ROUTINE W REFLEX MICROSCOPIC    EKG None  Radiology CT ABDOMEN PELVIS W CONTRAST  Result Date: 06/22/2020 CLINICAL DATA:  Diverticulitis suspected RLQ pain Patient reports weakness and diarrhea. Shoulder surgery 2 weeks ago. EXAM: CT ABDOMEN AND PELVIS WITH CONTRAST TECHNIQUE: Multidetector CT imaging of the abdomen and pelvis was performed using the standard protocol following bolus administration of intravenous contrast. CONTRAST:  160mL OMNIPAQUE IOHEXOL 300 MG/ML  SOLN COMPARISON:  None. FINDINGS: Lower chest: Hypoventilatory changes in the lung bases. The heart is normal in size with coronary artery calcifications. Patulous distal esophagus. Hepatobiliary: Subcentimeter enhancing focus in the right hepatic lobe, series 3, image 18, may be flash filling hemangioma or AP shunt. There is left greater than right intrahepatic biliary ductal dilatation. Proximal common bile duct is mildly dilated measuring 9 mm, with questionable  intraluminal density, series 3, image 35. Physiologically distended gallbladder. Fundal gallbladder wall calcifications as well as mild wall thickening, series 3, image 39. No pericholecystic fat stranding. Pancreas: No ductal dilatation or inflammation. Spleen: Normal in size without focal abnormality. Adrenals/Urinary Tract: Normal adrenal glands. No hydronephrosis or perinephric edema. Homogeneous renal enhancement with symmetric excretion on delayed phase imaging. Multiple small low-density lesions scattered throughout the left greater than right renal parenchyma are subcentimeter and too small to characterize. Urinary bladder is distended without wall thickening. Stomach/Bowel: Patulous distal esophagus decompressed stomach. Few fluid-filled small bowel loops in the pelvis without abnormal distension. Mild fecalization of distal ileal bowel loops. There is a blind-ending tubular structure in the right lower quadrant that is dilated at 17 mm and contains internal low-density. No wall hyperemia, wall thickening, or adjacent inflammation. Colonic wall thickening with mild colonic hyperemia involving the mid sigmoid colon, series 3, image 53. May be additional wall thickening involving the descending colon is nondistended limiting assessment. Moderate stool in the proximal as well as sigmoid colon. No significant diverticular disease. Vascular/Lymphatic: Moderate aortic atherosclerosis. No aortic aneurysm. The portal vein is patent. No enlarged lymph nodes in the abdomen or pelvis. There is no ileocolic adenopathy. Reproductive: Status post hysterectomy. No adnexal masses. Other: Trace free fluid in the pelvis which may be reactive. No upper abdominal ascites. No free air or focal fluid collection. There is a tiny fat containing umbilical hernia. Musculoskeletal: Scoliosis and multilevel degenerative change in the spine. There are no acute or suspicious osseous abnormalities. IMPRESSION: 1. Colonic wall thickening  with mild colonic hyperemia involving the mid sigmoid colon, suspicious for colitis. There may be additional wall thickening involving the descending colon, which is nondistended limiting assessment. No significant diverticular disease. 2. Dilated blind-ending tubular structure in the right lower quadrant measuring 17 mm contains internal low-density, without wall thickening, wall thickening, or adjacent inflammation. This is suspicious for an appendix mucocele. Recommend surgical consultation. 3. Mild  biliary dilatation with questionable intraluminal density in the mid common bile duct. Recommend correlation with LFTs. MRCP is recommended only if patient is able to tolerate breath hold technique. 4. 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 There is also mild gallbladder wall thickening. Aortic Atherosclerosis (ICD10-I70.0). Electronically Signed   By: Keith Rake M.D.   On: 06/22/2020 19:52   DG Chest Port 1 View  Result Date: 06/22/2020 CLINICAL DATA:  Weakness and diarrhea. Left shoulder surgery 2 weeks ago. EXAM: PORTABLE CHEST 1 VIEW COMPARISON:  None. FINDINGS: The cardiomediastinal contours are normal. Mild aortic atherosclerosis. The lungs are clear. Pulmonary vasculature is normal. No consolidation, pleural effusion, or pneumothorax. Reverse left shoulder arthroplasty, intact were visualized. No acute osseous abnormalities are seen. IMPRESSION: 1. No acute chest findings. 2. Reverse left shoulder arthroplasty, intact were visualized. 3.  Aortic Atherosclerosis (ICD10-I70.0). Electronically Signed   By: Keith Rake M.D.   On: 06/22/2020 18:14    Procedures Procedures   Medications Ordered in ED Medications  0.9 %  sodium chloride infusion (has no administration in time range)  ondansetron (ZOFRAN) injection 4 mg (has no administration in time range)    ED Course  I have reviewed the triage vital signs and the nursing notes.  Pertinent labs &  imaging results that were available during my care of the patient were reviewed by me and considered in my medical decision making (see chart for details).  With consideration of diverticulitis versus colitis versus other acute abdominal processes patient was started on fluid resuscitation, received antiemetics, analgesia, CT scan ordered.  Update: I discussed the patient's CT with our surgical team after I have reviewed the images.  With concern for colitis, possibly complicated by appendicitis we discussed findings.  Per surgery, patient's CT scan is not consistent with appendicitis.  On discussing her results with her, she notes that during the prior surgical procedure, hysterectomy, her surgeon remarked to her that she was unable to see the appendix on the patient.    9:49 PM Following fluid resuscitation, analgesia, antiemetics, the patient smiling.  She has had 1 bowel movement here, but has had no vomiting.  Vital signs now improved, with less tachycardia than on arrival.  We discussed CT findings again, concerning for colitis.  With surgery signed off on possibility for appendicitis, patient has amenable to initiation of antibiotics via IV.  We discussed admission versus outpatient follow-up.  Patient is too weak to ambulate, has ongoing diarrhea, and given notable CT abnormalities, will be admitted for ongoing antibiotics, resuscitation of systemic inflammatory response syndrome with colitis. MDM Rules/Calculators/A&P MDM Number of Diagnoses or Management Options Colitis: new, needed workup SIRS (systemic inflammatory response syndrome) ([place]): new, needed workup   Amount and/or Complexity of Data Reviewed Clinical lab tests: reviewed and ordered Tests in the radiology section of CPT: ordered and reviewed Tests in the medicine section of CPT: ordered and reviewed Decide to obtain previous medical records or to obtain history from someone other than the patient: yes Review and summarize  past medical records: yes Discuss the patient with other providers: yes Independent visualization of images, tracings, or specimens: yes  Risk of Complications, Morbidity, and/or Mortality Presenting problems: high Diagnostic procedures: high Management options: high  Critical Care Total time providing critical care: 30-74 minutes (35)  Patient Progress Patient progress: improved   Final Clinical Impression(s) / ED Diagnoses Final diagnoses:  Colitis  SIRS (systemic inflammatory response syndrome) ([facility])     Carmin Muskrat, MD 06/22/20 2153  Carmin Muskrat, MD 06/22/20 2255

## 2020-06-22 NOTE — ED Notes (Signed)
Xray at the bedside.

## 2020-06-23 ENCOUNTER — Inpatient Hospital Stay (HOSPITAL_COMMUNITY): Payer: Medicare Other

## 2020-06-23 ENCOUNTER — Encounter (HOSPITAL_COMMUNITY): Payer: Self-pay | Admitting: Internal Medicine

## 2020-06-23 DIAGNOSIS — F419 Anxiety disorder, unspecified: Secondary | ICD-10-CM

## 2020-06-23 DIAGNOSIS — K529 Noninfective gastroenteritis and colitis, unspecified: Secondary | ICD-10-CM | POA: Diagnosis present

## 2020-06-23 DIAGNOSIS — I5032 Chronic diastolic (congestive) heart failure: Secondary | ICD-10-CM | POA: Diagnosis present

## 2020-06-23 DIAGNOSIS — K828 Other specified diseases of gallbladder: Secondary | ICD-10-CM | POA: Diagnosis present

## 2020-06-23 DIAGNOSIS — E8809 Other disorders of plasma-protein metabolism, not elsewhere classified: Secondary | ICD-10-CM | POA: Diagnosis present

## 2020-06-23 DIAGNOSIS — R7303 Prediabetes: Secondary | ICD-10-CM | POA: Diagnosis present

## 2020-06-23 DIAGNOSIS — Z66 Do not resuscitate: Secondary | ICD-10-CM | POA: Diagnosis present

## 2020-06-23 DIAGNOSIS — A419 Sepsis, unspecified organism: Secondary | ICD-10-CM | POA: Diagnosis present

## 2020-06-23 DIAGNOSIS — K388 Other specified diseases of appendix: Secondary | ICD-10-CM | POA: Diagnosis present

## 2020-06-23 DIAGNOSIS — E876 Hypokalemia: Secondary | ICD-10-CM | POA: Diagnosis present

## 2020-06-23 DIAGNOSIS — Z885 Allergy status to narcotic agent status: Secondary | ICD-10-CM | POA: Diagnosis not present

## 2020-06-23 DIAGNOSIS — Z20822 Contact with and (suspected) exposure to covid-19: Secondary | ICD-10-CM | POA: Diagnosis present

## 2020-06-23 DIAGNOSIS — F32A Depression, unspecified: Secondary | ICD-10-CM

## 2020-06-23 DIAGNOSIS — I10 Essential (primary) hypertension: Secondary | ICD-10-CM | POA: Diagnosis not present

## 2020-06-23 DIAGNOSIS — Z8249 Family history of ischemic heart disease and other diseases of the circulatory system: Secondary | ICD-10-CM | POA: Diagnosis not present

## 2020-06-23 DIAGNOSIS — I110 Hypertensive heart disease with heart failure: Secondary | ICD-10-CM | POA: Diagnosis present

## 2020-06-23 DIAGNOSIS — Z91040 Latex allergy status: Secondary | ICD-10-CM | POA: Diagnosis not present

## 2020-06-23 DIAGNOSIS — E785 Hyperlipidemia, unspecified: Secondary | ICD-10-CM | POA: Diagnosis present

## 2020-06-23 DIAGNOSIS — Z79899 Other long term (current) drug therapy: Secondary | ICD-10-CM | POA: Diagnosis not present

## 2020-06-23 DIAGNOSIS — Z96612 Presence of left artificial shoulder joint: Secondary | ICD-10-CM | POA: Diagnosis present

## 2020-06-23 DIAGNOSIS — K219 Gastro-esophageal reflux disease without esophagitis: Secondary | ICD-10-CM | POA: Diagnosis present

## 2020-06-23 DIAGNOSIS — F418 Other specified anxiety disorders: Secondary | ICD-10-CM | POA: Diagnosis present

## 2020-06-23 HISTORY — DX: Chronic diastolic (congestive) heart failure: I50.32

## 2020-06-23 LAB — CBC WITH DIFFERENTIAL/PLATELET
Abs Immature Granulocytes: 0.27 10*3/uL — ABNORMAL HIGH (ref 0.00–0.07)
Basophils Absolute: 0.1 10*3/uL (ref 0.0–0.1)
Basophils Relative: 1 %
Eosinophils Absolute: 0 10*3/uL (ref 0.0–0.5)
Eosinophils Relative: 0 %
HCT: 33.5 % — ABNORMAL LOW (ref 36.0–46.0)
Hemoglobin: 10.3 g/dL — ABNORMAL LOW (ref 12.0–15.0)
Immature Granulocytes: 2 %
Lymphocytes Relative: 7 %
Lymphs Abs: 1.2 10*3/uL (ref 0.7–4.0)
MCH: 30.3 pg (ref 26.0–34.0)
MCHC: 30.7 g/dL (ref 30.0–36.0)
MCV: 98.5 fL (ref 80.0–100.0)
Monocytes Absolute: 1.8 10*3/uL — ABNORMAL HIGH (ref 0.1–1.0)
Monocytes Relative: 10 %
Neutro Abs: 14.4 10*3/uL — ABNORMAL HIGH (ref 1.7–7.7)
Neutrophils Relative %: 80 %
Platelets: 293 10*3/uL (ref 150–400)
RBC: 3.4 MIL/uL — ABNORMAL LOW (ref 3.87–5.11)
RDW: 13.7 % (ref 11.5–15.5)
WBC: 17.7 10*3/uL — ABNORMAL HIGH (ref 4.0–10.5)
nRBC: 0 % (ref 0.0–0.2)

## 2020-06-23 LAB — PROCALCITONIN: Procalcitonin: <0.1 ng/mL

## 2020-06-23 LAB — LACTIC ACID, PLASMA: Lactic Acid, Venous: 1.4 mmol/L (ref 0.5–1.9)

## 2020-06-23 LAB — COMPREHENSIVE METABOLIC PANEL
ALT: 13 U/L (ref 0–44)
AST: 17 U/L (ref 15–41)
Albumin: 2.9 g/dL — ABNORMAL LOW (ref 3.5–5.0)
Alkaline Phosphatase: 74 U/L (ref 38–126)
Anion gap: 8 (ref 5–15)
BUN: 10 mg/dL (ref 8–23)
CO2: 21 mmol/L — ABNORMAL LOW (ref 22–32)
Calcium: 8.5 mg/dL — ABNORMAL LOW (ref 8.9–10.3)
Chloride: 106 mmol/L (ref 98–111)
Creatinine, Ser: 0.56 mg/dL (ref 0.44–1.00)
GFR, Estimated: >60 mL/min (ref >60)
Glucose, Bld: 142 mg/dL — ABNORMAL HIGH (ref 70–99)
Potassium: 3.5 mmol/L (ref 3.5–5.1)
Sodium: 135 mmol/L (ref 135–145)
Total Bilirubin: 0.7 mg/dL (ref 0.3–1.2)
Total Protein: 5.8 g/dL — ABNORMAL LOW (ref 6.5–8.1)

## 2020-06-23 LAB — C-REACTIVE PROTEIN: CRP: 10 mg/dL — ABNORMAL HIGH (ref <1.0)

## 2020-06-23 LAB — MAGNESIUM: Magnesium: 1.7 mg/dL (ref 1.7–2.4)

## 2020-06-23 MED ORDER — CIPROFLOXACIN IN D5W 400 MG/200ML IV SOLN: 400.0000 mg | Freq: Two times a day (BID) | INTRAVENOUS | Status: DC

## 2020-06-23 MED ORDER — ONDANSETRON HCL 4 MG PO TABS: 4.0000 mg | ORAL_TABLET | Freq: Four times a day (QID) | ORAL | Status: DC | PRN

## 2020-06-23 MED ORDER — BUPROPION HCL ER (XL) 150 MG PO TB24
150.0000 mg | ORAL_TABLET | Freq: Every day | ORAL | Status: DC
  Administered 2020-06-23 – 2020-06-26 (×4): 150 mg via ORAL
  Filled 2020-06-23 (×4): qty 1

## 2020-06-23 MED ORDER — METHOCARBAMOL 500 MG PO TABS: 500.0000 mg | ORAL_TABLET | Freq: Four times a day (QID) | ORAL | Status: DC | PRN

## 2020-06-23 MED ORDER — SODIUM CHLORIDE 0.9 % IV SOLN: INTRAVENOUS | Status: AC

## 2020-06-23 MED ORDER — LISINOPRIL 5 MG PO TABS
5.0000 mg | ORAL_TABLET | Freq: Every day | ORAL | Status: DC
  Administered 2020-06-23 – 2020-06-26 (×4): 5 mg via ORAL
  Filled 2020-06-23 (×4): qty 1

## 2020-06-23 MED ORDER — CLONAZEPAM 1 MG PO TABS
1.0000 mg | ORAL_TABLET | Freq: Every day | ORAL | Status: DC
  Administered 2020-06-23 – 2020-06-25 (×3): 1 mg via ORAL
  Filled 2020-06-23 (×3): qty 1

## 2020-06-23 MED ORDER — CIPROFLOXACIN IN D5W 400 MG/200ML IV SOLN
400.0000 mg | Freq: Two times a day (BID) | INTRAVENOUS | Status: DC
  Filled 2020-06-23: qty 200

## 2020-06-23 MED ORDER — SERTRALINE HCL 100 MG PO TABS
100.0000 mg | ORAL_TABLET | Freq: Every day | ORAL | Status: DC
  Administered 2020-06-23 – 2020-06-26 (×4): 100 mg via ORAL
  Filled 2020-06-23: qty 1
  Filled 2020-06-23: qty 2
  Filled 2020-06-23 (×2): qty 1

## 2020-06-23 MED ORDER — ENOXAPARIN SODIUM 40 MG/0.4ML ~~LOC~~ SOLN
40.0000 mg | SUBCUTANEOUS | Status: DC
  Administered 2020-06-23 – 2020-06-26 (×4): 40 mg via SUBCUTANEOUS
  Filled 2020-06-23 (×4): qty 0.4

## 2020-06-23 MED ORDER — SODIUM CHLORIDE 0.9 % IV SOLN
2.0000 g | INTRAVENOUS | Status: DC
  Administered 2020-06-23 – 2020-06-25 (×3): 2 g via INTRAVENOUS
  Filled 2020-06-23 (×5): qty 20

## 2020-06-23 MED ORDER — METRONIDAZOLE IN NACL 5-0.79 MG/ML-% IV SOLN
500.0000 mg | Freq: Three times a day (TID) | INTRAVENOUS | Status: DC
  Filled 2020-06-23: qty 100

## 2020-06-23 MED ORDER — ACETAMINOPHEN 650 MG RE SUPP: 650.0000 mg | Freq: Four times a day (QID) | RECTAL | Status: DC | PRN

## 2020-06-23 MED ORDER — ONDANSETRON HCL 4 MG/2ML IJ SOLN: 4.0000 mg | Freq: Four times a day (QID) | INTRAMUSCULAR | Status: DC | PRN

## 2020-06-23 MED ORDER — METRONIDAZOLE IN NACL 5-0.79 MG/ML-% IV SOLN
500.0000 mg | Freq: Three times a day (TID) | INTRAVENOUS | Status: DC
  Administered 2020-06-23 – 2020-06-24 (×5): 500 mg via INTRAVENOUS
  Filled 2020-06-23 (×7): qty 100

## 2020-06-23 MED ORDER — ACETAMINOPHEN 325 MG PO TABS
650.0000 mg | ORAL_TABLET | Freq: Four times a day (QID) | ORAL | Status: DC | PRN
  Administered 2020-06-24: 650 mg via ORAL
  Filled 2020-06-23: qty 2

## 2020-06-23 NOTE — Progress Notes (Addendum)
PROGRESS NOTE    Lisa Morton  ZOX:[id number] DOB: 1940-08-24 DOA: 06/22/2020 PCP: Maurice Small, MD   Chief Complaint  Patient presents with  . Weakness    Weakness and diarrhea x1 day.    Brief Narrative:  80 year old female with past medical history of hypertension, depression, anxiety disorder, hyperlipidemia, diastolic congestive heart failure (Echo 12/2017 EF 65-70% G1DD) who presents to [facility] emergency department with complaints of diarrhea and generalized weakness.  CT findings were concerning for colitis. Also, possible appendix mucocele (discussed with surgery in the ED by the EDP - see below).  She's been admitted for IV abx and additional workup.   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 outpatient with surgery Consider MRCP pending results - consider outpatient - labs not c/w obstruction  Hypertension Lisinopril  Anxiety  Depression Clonazepam and zoloft and wellbutrin   HFpEF Appears euvolemic  DVT prophylaxis: (lovenox Code Status: DNR Family Communication: none at bedside Disposition:   Status is:  Observation  The patient will require care spanning > 2 midnights and should be moved to inpatient because: Inpatient level of care appropriate due to severity of illness  Dispo: The patient is from: Home              Anticipated d/c is to: Home              Patient currently is not medically stable to d/c.   Difficult to place patient No  Consultants:   none  Procedures: none  Antimicrobials:  Anti-infectives (From admission, onward)   Start     Dose/Rate Route Frequency Ordered Stop   06/23/20 1200  cefTRIAXone (ROCEPHIN) 2 g in sodium chloride 0.9 % 100 mL IVPB        2 g 200 mL/hr over 30 Minutes Intravenous Every 24 hours 06/23/20 1157     06/23/20 1000  ciprofloxacin (CIPRO) IVPB 400 mg  Status:  Discontinued        400 mg 200 mL/hr over 60 Minutes Intravenous Every 12 hours 06/23/20 0158 06/23/20 0340   06/23/20 1000  ciprofloxacin (CIPRO) IVPB 400 mg  Status:  Discontinued        400 mg 200 mL/hr over 60 Minutes Intravenous Every 12 hours 06/23/20 0342 06/23/20 1157   06/23/20 0800  metroNIDAZOLE (FLAGYL) IVPB 500 mg  Status:  Discontinued        500 mg 100 mL/hr over 60 Minutes Intravenous Every 8 hours 06/23/20 0158 06/23/20 0340   06/23/20 0800  metroNIDAZOLE (FLAGYL) IVPB 500 mg        500 mg 100 mL/hr over 60 Minutes Intravenous Every 8 hours 06/23/20 0342     06/22/20 2200  ciprofloxacin (CIPRO) IVPB 400 mg  400 mg 200 mL/hr over 60 Minutes Intravenous  Once 06/22/20 2149 06/22/20 2330   06/22/20 2200  metroNIDAZOLE (FLAGYL) IVPB 500 mg        500 mg 100 mL/hr over 60 Minutes Intravenous  Once 06/22/20 2149 06/23/20 0033   06/22/20 0000  metroNIDAZOLE (FLAGYL) 500 MG tablet  Status:  Discontinued        500 mg Oral 2 times daily 06/22/20 2154 06/22/20    06/22/20 0000  ciprofloxacin (CIPRO) 500 MG tablet        500 mg Oral 2 times daily 06/22/20 2154     06/22/20 0000  metroNIDAZOLE (FLAGYL) 500 MG tablet  Status:  Discontinued        500 mg Oral 2  times daily 06/22/20 2156 06/22/20    06/22/20 0000  metroNIDAZOLE (FLAGYL) 500 MG tablet        500 mg Oral 2 times daily 06/22/20 2157 06/29/20 2359         Subjective: No new complaints tat this time  Objective: Vitals:   06/23/20 0210 06/23/20 0545 06/23/20 1033 06/23/20 1415  BP: (!) 121/55 (!) 111/48 (!) 111/51 (!) 120/59  Pulse: 83 79 84 87  Resp: 20 16  20   Temp: 98.3 F (36.8 C) 99.2 F (37.3 C) 98.2 F (36.8 C) 98.3 F (36.8 C)  TempSrc: Oral Oral Axillary Oral  SpO2: 98% 97% 99% 96%  Weight:      Height:        Intake/Output Summary (Last 24 hours) at 06/23/2020 1420 Last data filed at 06/23/2020 1235 Gross per 24 hour  Intake 3045.6 ml  Output 500 ml  Net 2545.6 ml   Filed Weights   06/22/20 1816  Weight: 61.7 kg    Examination:  General exam: Appears calm and comfortable  Respiratory system: Clear to auscultation. Respiratory effort normal. Cardiovascular system: S1 & S2 heard, RRR.  Gastrointestinal system: Abdomen is distended, soft and mildly tender Central nervous system: Alert and oriented. No focal neurological deficits. Extremities: no LEE, LUE in sling Skin: No rashes, lesions or ulcers Psychiatry: Judgement and insight appear normal. Mood & affect appropriate.     Data Reviewed: I have personally reviewed following labs and imaging studies  CBC: Recent Labs  Lab 06/22/20 1753 06/23/20 0229  WBC 17.6* 17.7*  NEUTROABS 15.0* 14.4*  HGB 11.3* 10.3*  HCT 35.8* 33.5*  MCV 96.0 98.5  PLT 330 637    Basic Metabolic Panel: Recent Labs  Lab 06/22/20 1753 06/23/20 0229  NA 133* 135  K 4.0 3.5  CL 100 106  CO2 24 21*  GLUCOSE 132* 142*  BUN 14 10  CREATININE 0.58 0.56  CALCIUM 8.6* 8.5*  MG  --  1.7    GFR: Estimated Creatinine Clearance: 45.7 mL/min (by C-G formula based on SCr of 0.56 mg/dL).  Liver Function Tests: Recent Labs  Lab 06/22/20 1753 06/23/20 0229  AST 30 17  ALT 17 13  ALKPHOS 82 74  BILITOT 1.0 0.7   PROT 6.6 5.8*  ALBUMIN 3.3* 2.9*    CBG: No results for input(s): GLUCAP in the last 168 hours.   Recent Results (from the past 240 hour(s))  Resp Panel by RT-PCR (Flu Aanyah Loa&B, Covid) Nasopharyngeal Swab     Status: None   Collection Time: 06/22/20  9:40 PM   Specimen: Nasopharyngeal Swab; Nasopharyngeal(NP) swabs in vial transport medium  Result Value Ref Range Status   SARS Coronavirus 2 by RT PCR NEGATIVE NEGATIVE Final  Comment: (NOTE) SARS-CoV-2 target nucleic acids are NOT DETECTED.  The SARS-CoV-2 RNA is generally detectable in upper respiratory specimens during the acute phase of infection. The lowest concentration of SARS-CoV-2 viral copies this assay can detect is 138 copies/mL. Thomos Domine negative result does not preclude SARS-Cov-2 infection and should not be used as the sole basis for treatment or other patient management decisions. Izel Hochberg negative result may occur with  improper specimen collection/handling, submission of specimen other than nasopharyngeal swab, presence of viral mutation(s) within the areas targeted by this assay, and inadequate number of viral copies(<138 copies/mL). Karver Fadden negative result must be combined with clinical observations, patient history, and epidemiological information. The expected result is Negative.  Fact Sheet for Patients:  EntrepreneurPulse.com.au  Fact Sheet for Healthcare Providers:  IncredibleEmployment.be  This test is no t yet approved or cleared by the [place] FDA and  has been authorized for detection and/or diagnosis of SARS-CoV-2 by FDA under an Emergency Use Authorization (EUA). This EUA will remain  in effect (meaning this test can be used) for the duration of the COVID-19 declaration under Section 564(b)(1) of the Act, 21 U.S.C.section 360bbb-3(b)(1), unless the authorization is terminated  or revoked sooner.       Influenza Alveta Quintela by PCR NEGATIVE NEGATIVE Final   Influenza B by PCR  NEGATIVE NEGATIVE Final    Comment: (NOTE) The Xpert Xpress SARS-CoV-2/FLU/RSV plus assay is intended as an aid in the diagnosis of influenza from Nasopharyngeal swab specimens and should not be used as Miosotis Wetsel sole basis for treatment. Nasal washings and aspirates are unacceptable for Xpert Xpress SARS-CoV-2/FLU/RSV testing.  Fact Sheet for Patients: EntrepreneurPulse.com.au  Fact Sheet for Healthcare Providers: IncredibleEmployment.be  This test is not yet approved or cleared by the [place] FDA and has been authorized for detection and/or diagnosis of SARS-CoV-2 by FDA under an Emergency Use Authorization (EUA). This EUA will remain in effect (meaning this test can be used) for the duration of the COVID-19 declaration under Section 564(b)(1) of the Act, 21 U.S.C. section 360bbb-3(b)(1), unless the authorization is terminated or revoked.  Performed at [facility], [place] [address] [place], [place] [address]   Culture, blood (routine x 2)     Status: None (Preliminary result)   Collection Time: 06/23/20  2:29 AM   Specimen: BLOOD  Result Value Ref Range Status   Specimen Description   Final    BLOOD RIGHT ANTECUBITAL Performed at [place] [address] [place], [place] [address]    Special Requests   Final    BOTTLES DRAWN AEROBIC ONLY Blood Culture adequate volume Performed at [facility] [address], [place], [place] [address]    Culture   Final    NO GROWTH < 12 HOURS Performed at [facility] [address], [place], [place] [address]    Report Status PENDING  Incomplete  Culture, blood (routine x 2)     Status: None (Preliminary result)   Collection Time: 06/23/20  2:29 AM   Specimen: BLOOD  Result Value Ref Range Status   Specimen Description   Final    BLOOD BLOOD RIGHT FOREARM Performed at [facility] [address],  [place], [place] [address]    Special Requests   Final    BOTTLES DRAWN AEROBIC ONLY Blood Culture adequate volume Performed at [facility] [address] [place], [place] [address]    Culture   Final    NO  GROWTH < 12 HOURS Performed at [facility] [address], [place], [place] [address]    Report Status PENDING  Incomplete         Radiology Studies: CT ABDOMEN PELVIS W CONTRAST  Result Date: 06/22/2020 CLINICAL DATA:  Diverticulitis suspected RLQ pain Patient reports weakness and diarrhea. Shoulder surgery 2 weeks ago. EXAM: CT ABDOMEN AND PELVIS WITH CONTRAST TECHNIQUE: Multidetector CT imaging of the abdomen and pelvis was performed using the standard protocol following bolus administration of intravenous contrast. CONTRAST:  160mL OMNIPAQUE IOHEXOL 300 MG/ML  SOLN COMPARISON:  None. FINDINGS: Lower chest: Hypoventilatory changes in the lung bases. The heart is normal in size with coronary artery calcifications. Patulous distal esophagus. Hepatobiliary: Subcentimeter enhancing focus in the right hepatic lobe, series 3, image 18, may be flash filling hemangioma or AP shunt. There is left greater than right intrahepatic biliary ductal dilatation. Proximal common bile duct is mildly dilated measuring 9 mm, with questionable intraluminal density, series 3, image 35. Physiologically distended gallbladder. Fundal gallbladder wall calcifications as well as mild wall thickening, series 3, image 39. No pericholecystic fat stranding. Pancreas: No ductal dilatation or inflammation. Spleen: Normal in size without focal abnormality. Adrenals/Urinary Tract: Normal adrenal glands. No hydronephrosis or perinephric edema. Homogeneous renal enhancement with symmetric excretion on delayed phase imaging. Multiple small low-density lesions scattered throughout the left greater than right renal parenchyma are subcentimeter and too small to characterize. Urinary bladder is distended  without wall thickening. Stomach/Bowel: Patulous distal esophagus decompressed stomach. Few fluid-filled small bowel loops in the pelvis without abnormal distension. Mild fecalization of distal ileal bowel loops. There is Sonya Gunnoe blind-ending tubular structure in the right lower quadrant that is dilated at 17 mm and contains internal low-density. No wall hyperemia, wall thickening, or adjacent inflammation. Colonic wall thickening with mild colonic hyperemia involving the mid sigmoid colon, series 3, image 53. May be additional wall thickening involving the descending colon is nondistended limiting assessment. Moderate stool in the proximal as well as sigmoid colon. No significant diverticular disease. Vascular/Lymphatic: Moderate aortic atherosclerosis. No aortic aneurysm. The portal vein is patent. No enlarged lymph nodes in the abdomen or pelvis. There is no ileocolic adenopathy. Reproductive: Status post hysterectomy. No adnexal masses. Other: Trace free fluid in the pelvis which may be reactive. No upper abdominal ascites. No free air or focal fluid collection. There is Mekia Dipinto tiny fat containing umbilical hernia. Musculoskeletal: Scoliosis and multilevel degenerative change in the spine. There are no acute or suspicious osseous abnormalities. IMPRESSION: 1. Colonic wall thickening with mild colonic hyperemia involving the mid sigmoid colon, suspicious for colitis. There may be additional wall thickening involving the descending colon, which is nondistended limiting assessment. No significant diverticular disease. 2. Dilated blind-ending tubular structure in the right lower quadrant measuring 17 mm contains internal low-density, without wall thickening, wall thickening, or adjacent inflammation. This is suspicious for an appendix mucocele. Recommend surgical consultation. 3. Mild biliary dilatation with questionable intraluminal density in the mid common bile duct. Recommend correlation with LFTs. MRCP is recommended only  if patient is able to tolerate breath hold technique. 4. 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 There is also mild gallbladder wall thickening. Aortic Atherosclerosis (ICD10-I70.0). Electronically Signed   By: Keith Rake M.D.   On: 06/22/2020 19:52   DG Chest Port 1 View  Result Date: 06/22/2020 CLINICAL DATA:  Weakness and diarrhea. Left shoulder surgery 2 weeks ago. EXAM: PORTABLE CHEST 1 VIEW COMPARISON:  None. FINDINGS: The cardiomediastinal contours  are normal. Mild aortic atherosclerosis. The lungs are clear. Pulmonary vasculature is normal. No consolidation, pleural effusion, or pneumothorax. Reverse left shoulder arthroplasty, intact were visualized. No acute osseous abnormalities are seen. IMPRESSION: 1. No acute chest findings. 2. Reverse left shoulder arthroplasty, intact were visualized. 3.  Aortic Atherosclerosis (ICD10-I70.0). Electronically Signed   By: Keith Rake M.D.   On: 06/22/2020 18:14        Scheduled Meds: . buPROPion  150 mg Oral Daily  . clonazePAM  1 mg Oral QHS  . enoxaparin (LOVENOX) injection  40 mg Subcutaneous Q24H  . lisinopril  5 mg Oral Daily  . sertraline  100 mg Oral Daily   Continuous Infusions: . sodium chloride 100 mL/hr at 06/23/20 0223  . cefTRIAXone (ROCEPHIN)  IV    . metronidazole 500 mg (06/23/20 0935)     LOS: 0 days    Time spent: over 30 min    Fayrene Helper, MD [facility]   To contact the attending provider between 7A-7P or the covering provider during after hours 7P-7A, please log into the web site [email or web address] and access using universal [place] password for that web site. If you do not have the password, please call the hospital operator.  06/23/2020, 2:20 PM

## 2020-06-23 NOTE — H&P (Signed)
History and Physical    Lisa Morton WGN:[id number] DOB: 03-Dec-1940 DOA: 06/22/2020  PCP: Maurice Small, MD  Patient coming from: Home via EMS   Chief Complaint:  Chief Complaint  Patient presents with  . Weakness    Weakness and diarrhea      HPI:    80 year old female with past medical history of hypertension, depression, anxiety disorder, hyperlipidemia, diastolic congestive heart failure (Echo 12/2017 EF 65-70% G1DD) who presents to [facility] emergency department with complaints of diarrhea and generalized weakness.  Patient explains that for the past 3 to 4 days she has been experiencing lower abdominal pain.  His lower abdominal pain is moderate to severe in intensity, waxing and waning in quality and sharp in quality.  This is associated with frequent episodes of watery diarrhea at least 4 episodes daily.  Patient denies any associated fevers, sick contacts, vomiting, recent ingestion of undercooked food or recent contacts with confirmed Covid infection.  As patient's symptoms persisted over the next evaluation began to develop associated weakness.  Patient's weakness is worsened to the point where she was having difficulty ambulating and it was at this point that EMS was contacted and the patient was promptly brought to [facility] emergency department for evaluation.  Upon evaluation in the emergency department CT imaging of the abdomen revealed colonic wall thickening of the mid sigmoid colon.  Additionally, there is suspicion for an appendix mucocele.  Emergency department provider discussed case with Dr. Durward Fortes who reviewed the CT images.  Based on patient's presentation and emergency department provider's abdominal exam the surgeon did not feel the patient was suffering from appendicitis.  Patient was initiated on intravenous ciprofloxacin and Flagyl for suspected infectious colitis and the hospitalist group was then called to assess the patient for admission  to the hospital.  Review of Systems:   Review of Systems  Gastrointestinal: Positive for abdominal pain and diarrhea.  Neurological: Positive for weakness.    Past Medical History:  Diagnosis Date  . Chronic diastolic CHF (congestive heart failure) ([place]) 06/23/2020  . Closed fracture of left proximal humerus 06/04/2020  . Depression   . GERD (gastroesophageal reflux disease)    Rare  . History of migraine   . History of syncope    Full negative cardiac workup  . Hyperlipemia   . Hypertension   . Numbness    Left heel, sharp itching pain  . OA (osteoarthritis)    lumbar spine, left hip  . Pre-diabetes   . Restless leg syndrome    on klonidipin    Past Surgical History:  Procedure Laterality Date  . ABDOMINAL HYSTERECTOMY  1984  . CATARACT EXTRACTION  07/2011  . CESAREAN SECTION     x2  . COLONOSCOPY    . CYSTOCELE REPAIR  01/26/2012   Procedure: ANTERIOR REPAIR (CYSTOCELE);  Surgeon: Thurnell Lose, MD;  Location: [facility];  Service: Gynecology;  Laterality: N/A;  . REVERSE SHOULDER ARTHROPLASTY Left 06/04/2020   Procedure: REVERSE SHOULDER ARTHROPLASTY;  Surgeon: Verner Mould, MD;  Location: [facility];  Service: Orthopedics;  Laterality: Left;  with block     reports that she has never smoked. She has never used smokeless tobacco. She reports that she does not drink alcohol and does not use drugs.  Allergies  Allergen Reactions  . Codeine Nausea And Vomiting  . Latex Rash    Prolonged exposure to skin  . Penicillins Rash    Family History  Problem Relation Age of Onset  .  Cancer Mother   . Heart attack Father        No details died at age 3  . Bipolar disorder Sister   . Breast cancer Maternal Grandmother      Prior to Admission medications   Medication Sig Start Date End Date Taking? Authorizing Provider  ciprofloxacin (CIPRO) 500 MG tablet Take 1 tablet (500 mg total) by mouth 2 (two) times daily. 06/22/20  Yes Carmin Muskrat, MD  ondansetron  (ZOFRAN ODT) 4 MG disintegrating tablet Take 1 tablet (4 mg total) by mouth every 8 (eight) hours as needed for nausea or vomiting. 06/22/20  Yes Carmin Muskrat, MD  Bioflavonoid Products (ESTER C PO) Take 1 tablet by mouth daily.    [provider]  buPROPion (WELLBUTRIN XL) 150 MG 24 hr tablet Take 150 mg by mouth daily.    [provider]  Calcium-Magnesium-Zinc (CAL-MAG-ZINC PO) Take 1 tablet by mouth daily.    [provider]  Cholecalciferol (VITAMIN D) 50 MCG (2000 UT) CAPS Take 2,000 Units by mouth daily.    [provider]  clonazePAM (KLONOPIN) 1 MG tablet Take 1 mg by mouth at bedtime.     [provider]  docusate sodium (COLACE) 100 MG capsule Take 1 capsule (100 mg total) by mouth 2 (two) times daily. 06/05/20   Verner Mould, MD  HYDROcodone-acetaminophen (NORCO/VICODIN) 5-325 MG tablet Take 1-2 tablets by mouth every 4 (four) hours as needed for moderate pain (pain score 4-6). 06/05/20   Avanell Shackleton III, MD  lisinopril (PRINIVIL,ZESTRIL) 5 MG tablet Take 5 mg by mouth daily.    [provider]  methocarbamol (ROBAXIN) 500 MG tablet Take 1 tablet (500 mg total) by mouth every 6 (six) hours as needed for muscle spasms. 06/05/20   Avanell Shackleton III, MD  metroNIDAZOLE (FLAGYL) 500 MG tablet Take 1 tablet (500 mg total) by mouth 2 (two) times daily for 7 days. For ten days 06/22/20 06/29/20  Carmin Muskrat, MD  Multiple Vitamins-Minerals (CENTRUM SILVER ADULT 50+ PO) Take 1 tablet by mouth daily.    [provider]  OVER THE COUNTER MEDICATION Take 1 capsule by mouth 3 (three) times a week. Blood Builder- contains Vitamin C, Folate, Vitamin B12, and Iron. Takes on Wed and Sunday.    [provider]  Red Yeast Rice 600 MG CAPS Take 1,200 mg by mouth in the morning and at bedtime.    [provider]  sertraline (ZOLOFT) 100 MG tablet Take 100 mg by mouth daily. 06/30/16   [provider]   vitamin B-12 (CYANOCOBALAMIN) 1000 MCG tablet Take 1,000 mcg by mouth 4 (four) times a week.     [provider]    Physical Exam: Vitals:   06/22/20 2000 06/22/20 2100 06/22/20 2200 06/22/20 2230  BP: (!) 129/56 118/69 (!) 133/59 (!) 126/59  Pulse: (!) 101 (!) 103 99 95  Resp: 18 17 17 19   Temp:      TempSrc:      SpO2: 98% 97% 94% 95%  Weight:      Height:        Constitutional: Awake alert and oriented x3, no associated distress.   Skin: no rashes, no lesions, poor skin turgor noted. Eyes: Pupils are equally reactive to light.  No evidence of scleral icterus or conjunctival pallor.  ENMT: Dry mucous membranes noted.  Posterior pharynx clear of any exudate or lesions.   Neck: normal, supple, no masses, no thyromegaly.  No  evidence of jugular venous distension.   Respiratory: clear to auscultation bilaterally, no wheezing, no crackles. Normal respiratory effort. No accessory muscle use.  Cardiovascular: Regular rate and rhythm, no murmurs / rubs / gallops. No extremity edema. 2+ pedal pulses. No carotid bruits.  Chest:   Nontender without crepitus or deformity.   Back:   Nontender without crepitus or deformity. Abdomen: Notable tenderness of the right and left lower quadrant.  Question rebound tenderness.  Abdomen is soft however.  No evidence of intra-abdominal masses.  Positive bowel sounds noted in all quadrants.   Musculoskeletal: No joint deformity upper and lower extremities. Good ROM, no contractures. Normal muscle tone.  Neurologic: CN 2-12 grossly intact. Sensation intact.  Patient moving all 4 extremities spontaneously.  Patient is following all commands.  Patient is responsive to verbal stimuli.   Psychiatric: Patient exhibits normal mood with appropriate affect.  Patient seems to possess insight as to their current situation.     Labs on Admission: I have personally reviewed following labs and imaging studies -   CBC: Recent Labs  Lab 06/22/20 1753  WBC  17.6*  NEUTROABS 15.0*  HGB 11.3*  HCT 35.8*  MCV 96.0  PLT 638   Basic Metabolic Panel: Recent Labs  Lab 06/22/20 1753  NA 133*  K 4.0  CL 100  CO2 24  GLUCOSE 132*  BUN 14  CREATININE 0.58  CALCIUM 8.6*   GFR: Estimated Creatinine Clearance: 45.7 mL/min (by C-G formula based on SCr of 0.58 mg/dL). Liver Function Tests: Recent Labs  Lab 06/22/20 1753  AST 30  ALT 17  ALKPHOS 82  BILITOT 1.0  PROT 6.6  ALBUMIN 3.3*   Recent Labs  Lab 06/22/20 1753  LIPASE 29   No results for input(s): AMMONIA in the last 168 hours. Coagulation Profile: No results for input(s): INR, PROTIME in the last 168 hours. Cardiac Enzymes: No results for input(s): CKTOTAL, CKMB, CKMBINDEX, TROPONINI in the last 168 hours. BNP (last 3 results) No results for input(s): PROBNP in the last 8760 hours. HbA1C: No results for input(s): HGBA1C in the last 72 hours. CBG: No results for input(s): GLUCAP in the last 168 hours. Lipid Profile: No results for input(s): CHOL, HDL, LDLCALC, TRIG, CHOLHDL, LDLDIRECT in the last 72 hours. Thyroid Function Tests: No results for input(s): TSH, T4TOTAL, FREET4, T3FREE, THYROIDAB in the last 72 hours. Anemia Panel: No results for input(s): VITAMINB12, FOLATE, FERRITIN, TIBC, IRON, RETICCTPCT in the last 72 hours. Urine analysis:    Component Value Date/Time   COLORURINE YELLOW 06/22/2020 2125   APPEARANCEUR CLEAR 06/22/2020 2125   LABSPEC 1.030 06/22/2020 2125   PHURINE 7.0 06/22/2020 2125   GLUCOSEU NEGATIVE 06/22/2020 2125   HGBUR MODERATE (A) 06/22/2020 2125   BILIRUBINUR NEGATIVE 06/22/2020 2125   [place] NEGATIVE 06/22/2020 2125   PROTEINUR NEGATIVE 06/22/2020 2125   NITRITE NEGATIVE 06/22/2020 2125   LEUKOCYTESUR NEGATIVE 06/22/2020 2125    Radiological Exams on Admission - Personally Reviewed: CT ABDOMEN PELVIS W CONTRAST  Result Date: 06/22/2020 CLINICAL DATA:  Diverticulitis suspected RLQ pain Patient reports weakness and diarrhea.  Shoulder surgery 2 weeks ago. EXAM: CT ABDOMEN AND PELVIS WITH CONTRAST TECHNIQUE: Multidetector CT imaging of the abdomen and pelvis was performed using the standard protocol following bolus administration of intravenous contrast. CONTRAST:  171mL OMNIPAQUE IOHEXOL 300 MG/ML  SOLN COMPARISON:  None. FINDINGS: Lower chest: Hypoventilatory changes in the lung bases. The heart is normal in size with coronary artery calcifications. Patulous distal esophagus. Hepatobiliary: Subcentimeter  enhancing focus in the right hepatic lobe, series 3, image 18, may be flash filling hemangioma or AP shunt. There is left greater than right intrahepatic biliary ductal dilatation. Proximal common bile duct is mildly dilated measuring 9 mm, with questionable intraluminal density, series 3, image 35. Physiologically distended gallbladder. Fundal gallbladder wall calcifications as well as mild wall thickening, series 3, image 39. No pericholecystic fat stranding. Pancreas: No ductal dilatation or inflammation. Spleen: Normal in size without focal abnormality. Adrenals/Urinary Tract: Normal adrenal glands. No hydronephrosis or perinephric edema. Homogeneous renal enhancement with symmetric excretion on delayed phase imaging. Multiple small low-density lesions scattered throughout the left greater than right renal parenchyma are subcentimeter and too small to characterize. Urinary bladder is distended without wall thickening. Stomach/Bowel: Patulous distal esophagus decompressed stomach. Few fluid-filled small bowel loops in the pelvis without abnormal distension. Mild fecalization of distal ileal bowel loops. There is a blind-ending tubular structure in the right lower quadrant that is dilated at 17 mm and contains internal low-density. No wall hyperemia, wall thickening, or adjacent inflammation. Colonic wall thickening with mild colonic hyperemia involving the mid sigmoid colon, series 3, image 53. May be additional wall thickening  involving the descending colon is nondistended limiting assessment. Moderate stool in the proximal as well as sigmoid colon. No significant diverticular disease. Vascular/Lymphatic: Moderate aortic atherosclerosis. No aortic aneurysm. The portal vein is patent. No enlarged lymph nodes in the abdomen or pelvis. There is no ileocolic adenopathy. Reproductive: Status post hysterectomy. No adnexal masses. Other: Trace free fluid in the pelvis which may be reactive. No upper abdominal ascites. No free air or focal fluid collection. There is a tiny fat containing umbilical hernia. Musculoskeletal: Scoliosis and multilevel degenerative change in the spine. There are no acute or suspicious osseous abnormalities. IMPRESSION: 1. Colonic wall thickening with mild colonic hyperemia involving the mid sigmoid colon, suspicious for colitis. There may be additional wall thickening involving the descending colon, which is nondistended limiting assessment. No significant diverticular disease. 2. Dilated blind-ending tubular structure in the right lower quadrant measuring 17 mm contains internal low-density, without wall thickening, wall thickening, or adjacent inflammation. This is suspicious for an appendix mucocele. Recommend surgical consultation. 3. Mild biliary dilatation with questionable intraluminal density in the mid common bile duct. Recommend correlation with LFTs. MRCP is recommended only if patient is able to tolerate breath hold technique. 4. 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 There is also mild gallbladder wall thickening. Aortic Atherosclerosis (ICD10-I70.0). Electronically Signed   By: Keith Rake M.D.   On: 06/22/2020 19:52   DG Chest Port 1 View  Result Date: 06/22/2020 CLINICAL DATA:  Weakness and diarrhea. Left shoulder surgery 2 weeks ago. EXAM: PORTABLE CHEST 1 VIEW COMPARISON:  None. FINDINGS: The cardiomediastinal contours are normal. Mild aortic  atherosclerosis. The lungs are clear. Pulmonary vasculature is normal. No consolidation, pleural effusion, or pneumothorax. Reverse left shoulder arthroplasty, intact were visualized. No acute osseous abnormalities are seen. IMPRESSION: 1. No acute chest findings. 2. Reverse left shoulder arthroplasty, intact were visualized. 3.  Aortic Atherosclerosis (ICD10-I70.0). Electronically Signed   By: Keith Rake M.D.   On: 06/22/2020 18:14    EKG: Personally reviewed.  Rhythm is normal sinus rhythm with heart rate of 89 bpm.  No dynamic ST segment changes appreciated.  Assessment/Plan Principal Problem: Abdominal pain and diarrhea concerning for acute colitis   Patient presenting with 3 to 4-day history of lower abdominal pain as well as watery diarrhea  On arrival patient found  to have substantial leukocytosis with CT imaging bringing about concern for colitis with colonic wall thickening of the mid sigmoid colon and some of the descending colon.  Appendix mucocele was also identified although the CT images were reviewed with Dr. Donne Hazel with surgery by the emergency department provider.  It was felt that these findings were likely not related to disease of the appendix.  We are therefore treating the patient with ciprofloxacin and Flagyl for suspected infectious colitis -particularly due to the substantial leukocytosis  If abdominal pain continues to worsen despite this intervention, will consider obtaining formal surgery consultation for reevaluation  Other etiologies that are considered but less likely include ischemic colitis and inflammatory bowel disease.  Leukocytosis makes ischemic colitis less likely.  Acute course of illness makes inflammatory bowel disease very unlikely.  Active Problems:   Sepsis ([place])   Patient exhibiting multiple SIRS criteria in addition to suspected infectious process concerning for early sepsis  Hydrating patient with intravenous isotonic  fluids  Blood cultures obtained  Monitoring closely for symptomatic improvement.    Essential hypertension   Continue home regimen of lisinopril     Anxiety and depression   Continue home regimen of clonazepam and sertraline    Chronic diastolic CHF (congestive heart failure) ([facility])  No evidence of cardiogenic volume overload    Code Status:  DNR Family Communication: deferred   Status is: Observation  The patient remains OBS appropriate and will d/c before 2 midnights.  Dispo: The patient is from: Home              Anticipated d/c is to: Home              Patient currently is not medically stable to d/c.   Difficult to place patient No        Vernelle Emerald MD [facility] Pager [phone]  If 7PM-7AM, please contact night-coverage [email or web address] Use universal [place] password for that web site. If you do not have the password, please call the hospital operator.  06/23/2020, 12:28 AM

## 2020-06-24 ENCOUNTER — Inpatient Hospital Stay (HOSPITAL_COMMUNITY): Payer: Medicare Other

## 2020-06-24 LAB — CBC WITH DIFFERENTIAL/PLATELET
Abs Immature Granulocytes: 0.03 10*3/uL (ref 0.00–0.07)
Basophils Absolute: 0.1 10*3/uL (ref 0.0–0.1)
Basophils Relative: 1 %
Eosinophils Absolute: 0.3 10*3/uL (ref 0.0–0.5)
Eosinophils Relative: 3 %
HCT: 30.4 % — ABNORMAL LOW (ref 36.0–46.0)
Hemoglobin: 9.5 g/dL — ABNORMAL LOW (ref 12.0–15.0)
Immature Granulocytes: 0 %
Lymphocytes Relative: 16 %
Lymphs Abs: 1.4 10*3/uL (ref 0.7–4.0)
MCH: 30.5 pg (ref 26.0–34.0)
MCHC: 31.3 g/dL (ref 30.0–36.0)
MCV: 97.7 fL (ref 80.0–100.0)
Monocytes Absolute: 0.7 10*3/uL (ref 0.1–1.0)
Monocytes Relative: 8 %
Neutro Abs: 6.5 10*3/uL (ref 1.7–7.7)
Neutrophils Relative %: 72 %
Platelets: 238 10*3/uL (ref 150–400)
RBC: 3.11 MIL/uL — ABNORMAL LOW (ref 3.87–5.11)
RDW: 13.6 % (ref 11.5–15.5)
WBC: 9 10*3/uL (ref 4.0–10.5)
nRBC: 0 % (ref 0.0–0.2)

## 2020-06-24 LAB — COMPREHENSIVE METABOLIC PANEL
ALT: 11 U/L (ref 0–44)
AST: 14 U/L — ABNORMAL LOW (ref 15–41)
Albumin: 2.5 g/dL — ABNORMAL LOW (ref 3.5–5.0)
Alkaline Phosphatase: 61 U/L (ref 38–126)
Anion gap: 8 (ref 5–15)
BUN: 8 mg/dL (ref 8–23)
CO2: 20 mmol/L — ABNORMAL LOW (ref 22–32)
Calcium: 8.2 mg/dL — ABNORMAL LOW (ref 8.9–10.3)
Chloride: 108 mmol/L (ref 98–111)
Creatinine, Ser: 0.57 mg/dL (ref 0.44–1.00)
GFR, Estimated: >60 mL/min (ref >60)
Glucose, Bld: 128 mg/dL — ABNORMAL HIGH (ref 70–99)
Potassium: 3.2 mmol/L — ABNORMAL LOW (ref 3.5–5.1)
Sodium: 136 mmol/L (ref 135–145)
Total Bilirubin: 0.4 mg/dL (ref 0.3–1.2)
Total Protein: 5.1 g/dL — ABNORMAL LOW (ref 6.5–8.1)

## 2020-06-24 LAB — C-REACTIVE PROTEIN: CRP: 9.2 mg/dL — ABNORMAL HIGH (ref <1.0)

## 2020-06-24 LAB — MAGNESIUM: Magnesium: 1.9 mg/dL (ref 1.7–2.4)

## 2020-06-24 LAB — PHOSPHORUS: Phosphorus: 2.8 mg/dL (ref 2.5–4.6)

## 2020-06-24 MED ORDER — GADOBUTROL 1 MMOL/ML IV SOLN
6.0000 mL | Freq: Once | INTRAVENOUS | Status: AC | PRN
  Administered 2020-06-24: 6 mL via INTRAVENOUS

## 2020-06-24 MED ORDER — METRONIDAZOLE IN NACL 5-0.79 MG/ML-% IV SOLN
500.0000 mg | Freq: Three times a day (TID) | INTRAVENOUS | Status: DC
  Administered 2020-06-25 – 2020-06-26 (×4): 500 mg via INTRAVENOUS
  Filled 2020-06-24 (×6): qty 100

## 2020-06-24 MED ORDER — POTASSIUM CHLORIDE CRYS ER 20 MEQ PO TBCR
40.0000 meq | EXTENDED_RELEASE_TABLET | Freq: Once | ORAL | Status: AC
  Administered 2020-06-24: 40 meq via ORAL
  Filled 2020-06-24: qty 2

## 2020-06-24 NOTE — Progress Notes (Signed)
Physical Therapy Treatment Patient Details Name: Lisa Morton MRN: [id number] DOB: 1940-12-25 Today's Date: 06/24/2020    History of Present Illness 80 year old female with past medical history of hypertension, depression, anxiety disorder, hyperlipidemia, diastolic congestive heart failure (Echo 12/2017 EF 65-70% G1DD) who presents to [facility] emergency department with complaints of diarrhea and generalized weakness. CT findings were concerning for colitis. Also had L reverse TSA on 3/21 after humeral fracture.    PT Comments    Pt up to ambulate in halls with noted instability and one LOB requiring assist to recover.  Pt states she if very careful at home and reliant on furniture and walls as well as SPC to assist with balance.     Follow Up Recommendations  Home health PT     Equipment Recommendations  None recommended by PT    Recommendations for Other Services       Precautions / Restrictions Precautions Precautions: Fall;Shoulder Type of Shoulder Precautions: AROM elbow, wrist and hand ok, per operative note okay for gentle pendulums, no A/PROM at shoulder (prior prior chart) Shoulder Interventions: Shoulder sling/immobilizer;Off for dressing/bathing/exercises Precaution Booklet Issued: Yes (comment) Required Braces or Orthoses: Sling Restrictions Weight Bearing Restrictions: Yes LUE Weight Bearing: Non weight bearing Other Position/Activity Restrictions: NO AROM/PROM of shoulder; sling at all times    Mobility  Bed Mobility               General bed mobility comments: Pt up in chair on arrival and in bathroom at session end    Transfers Overall transfer level: Needs assistance Equipment used: None Transfers: Sit to/from Stand Sit to Stand: Min guard Stand pivot transfers: Min guard;Min assist       General transfer comment: steady assist only for safety  Ambulation/Gait Ambulation/Gait assistance: Min guard;Min assist Gait Distance  (Feet): 400 Feet Assistive device: Straight cane Gait Pattern/deviations: Step-through pattern;Drifts right/left Gait velocity: decreased   General Gait Details: Pt with general instability and noted drift in either direction.  Cues for more awareness of cane management.  Pt with one LOB to L requiring min assist to regain balance   Stairs             Wheelchair Mobility    Modified Rankin (Stroke Patients Only)       Balance Overall balance assessment: Needs assistance Sitting-balance support: No upper extremity supported Sitting balance-Leahy Scale: Good     Standing balance support: Single extremity supported;No upper extremity supported Standing balance-Leahy Scale: Fair Standing balance comment: pt was able to maintain static standing balance without UE support and assist from therapist                            Cognition Arousal/Alertness: Awake/alert Behavior During Therapy: WFL for tasks assessed/performed Overall Cognitive Status: Within Functional Limits for tasks assessed                                 General Comments: AxO x 3 very pleasant      Exercises      General Comments        Pertinent Vitals/Pain Pain Assessment: Faces Faces Pain Scale: Hurts a little bit Pain Location: L shoulder Pain Descriptors / Indicators: Guarding;Grimacing Pain Intervention(s): Limited activity within patient's tolerance;Monitored during session    [place] expects to be discharged to:: Private residence Living Arrangements: Alone Available Help at Discharge:  Family;Available PRN/intermittently Type of Home: House Home Access: Stairs to enter Entrance Stairs-Rails: Right;Left Home Layout: One level Home Equipment: Cane - quad;Cane - single point;Bedside commode Additional Comments: Pt will have intermittent assist from neighbors, daughters, and son- in laws who live close by. She can have some daytime and  intermittent over night supervision but not 24/7. Pt reports that her daughter does her grocery shopping and that she does not drive anymore. Reports one daughter has been working from ther house    Prior Function Level of Independence: Needs assistance  Gait / Transfers Assistance Needed: cane PRN ADL's / Homemaking Assistance Needed: increased time due to recent shoulder surgery Comments: Pt states balance and independence slowly improving since shoulder surgery   PT Goals (current goals can now be found in the care plan section) Acute Rehab PT Goals Patient Stated Goal: regain independence PT Goal Formulation: With patient Time For Goal Achievement: 06/11/20 Potential to Achieve Goals: Good Progress towards PT goals: Progressing toward goals    Frequency    Min 3X/week      PT Plan Current plan remains appropriate    Co-evaluation              AM-PAC PT "6 Clicks" Mobility   Outcome Measure  Help needed turning from your back to your side while in a flat bed without using bedrails?: A Little Help needed moving from lying on your back to sitting on the side of a flat bed without using bedrails?: A Little Help needed moving to and from a bed to a chair (including a wheelchair)?: A Little Help needed standing up from a chair using your arms (e.g., wheelchair or bedside chair)?: A Little Help needed to walk in hospital room?: A Little Help needed climbing 3-5 steps with a railing? : A Lot 6 Click Score: 17    End of Session Equipment Utilized During Treatment: Gait belt Activity Tolerance: Patient tolerated treatment well Patient left: Other (comment) (In bathroom with RN aware) Nurse Communication: Mobility status PT Visit Diagnosis: Unsteadiness on feet (R26.81);History of falling (Z91.81)     Time: 1657-9038 PT Time Calculation (min) (ACUTE ONLY): 21 min  Charges:  $Gait Training: 8-22 mins                     [place] Pager [phone] Office [phone]    Elenie Coven 06/24/2020, 3:24 PM

## 2020-06-24 NOTE — Progress Notes (Signed)
PROGRESS NOTE    Lisa Morton  XLK:[id number] DOB: [birth year] DOA: 06/22/2020 PCP: Maurice Small, MD   Chief Complaint  Patient presents with  . Weakness    Weakness and diarrhea x1 day.    Brief Narrative:  80 year old female with past medical history of hypertension, depression, anxiety disorder, hyperlipidemia, diastolic congestive heart failure (Echo 12/2017 EF 65-70% G1DD) who presents to [facility] emergency department with complaints of diarrhea and generalized weakness.  CT findings were concerning for colitis. Also, possible appendix mucocele (discussed with surgery in the ED by the EDP - see below).  She's been admitted for IV abx and additional workup.   Assessment & Plan:   Principal Problem:   Acute colitis Active Problems:   Essential hypertension   Anxiety and depression   Sepsis ([place])   Chronic diastolic CHF (congestive heart failure) (HCC)   Colitis  Abdominal Pain  Diarrhea  Sepsis 2/2 Colitis  CT notable for colonic wall thickening and mild colonic hyperemia involving the mid sigmoid colon, concerning for colitis Follow c diff, GI path panel - pending Ceftriaxone/flagyl Leukocytosis, tachycardia in setting of colitis meet criteria for sepsis Follow pending blood cultures NGTDx1 GI c/s, appreciate recs - see Dr. 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 Chrissie Noa from surgery, findings not thought 2/2 disease of appendix Discussed with Dr. Donne Hazel, recommended colonoscopy and surgery follow up outpatient   Mild biliary dilatation with questionable intraluminal density in mid common bile duct Follow RUQ [place] -> funadal gallbladder wall thickening with calcifications and possible multiple small noncalcified stones in the gallbladder neck - hypoechoic mass like tissue in the gallbladder concerning for gallbladder mass like gallbladder carcinoma Needs  to follow porcelain gallbladder outpatient with surgery Will get MRCP to evaluate further  Hypertension Lisinopril  Anxiety  Depression Clonazepam and zoloft and wellbutrin   HFpEF Appears euvolemic  DVT prophylaxis: (lovenox Code Status: DNR Family Communication: none at bedside Disposition:   Status is: inpatient   The patient will require care spanning > 2 midnights and should be moved to inpatient because: Inpatient level of care appropriate due to severity of illness  Dispo: The patient is from: Home              Anticipated d/c is to: Home              Patient currently is not medically stable to d/c.   Difficult to place patient No  Consultants:   none  Procedures: none  Antimicrobials:  Anti-infectives (From admission, onward)   Start     Dose/Rate Route Frequency Ordered Stop   06/23/20 1200  cefTRIAXone (ROCEPHIN) 2 g in sodium chloride 0.9 % 100 mL IVPB        2 g 200 mL/hr over 30 Minutes Intravenous Every 24 hours 06/23/20 1157     06/23/20 1000  ciprofloxacin (CIPRO) IVPB 400 mg  Status:  Discontinued        400 mg 200 mL/hr over 60 Minutes Intravenous Every 12 hours 06/23/20 0158 06/23/20 0340   06/23/20 1000  ciprofloxacin (CIPRO) IVPB 400 mg  Status:  Discontinued        400 mg 200 mL/hr over 60 Minutes Intravenous Every 12 hours 06/23/20 0342 06/23/20 1157   06/23/20 0800  metroNIDAZOLE (FLAGYL) IVPB 500 mg  Status:  Discontinued        500 mg 100 mL/hr over 60 Minutes Intravenous  Every 8 hours 06/23/20 0158 06/23/20 0340   06/23/20 0800  metroNIDAZOLE (FLAGYL) IVPB 500 mg        500 mg 100 mL/hr over 60 Minutes Intravenous Every 8 hours 06/23/20 0342     06/22/20 2200  ciprofloxacin (CIPRO) IVPB 400 mg        400 mg 200 mL/hr over 60 Minutes Intravenous  Once 06/22/20 2149 06/22/20 2330   06/22/20 2200  metroNIDAZOLE (FLAGYL) IVPB 500 mg        500 mg 100 mL/hr over 60 Minutes Intravenous  Once 06/22/20 2149 06/23/20 0033   06/22/20 0000   metroNIDAZOLE (FLAGYL) 500 MG tablet  Status:  Discontinued        500 mg Oral 2 times daily 06/22/20 2154 06/22/20    06/22/20 0000  ciprofloxacin (CIPRO) 500 MG tablet        500 mg Oral 2 times daily 06/22/20 2154     06/22/20 0000  metroNIDAZOLE (FLAGYL) 500 MG tablet  Status:  Discontinued        500 mg Oral 2 times daily 06/22/20 2156 06/22/20    06/22/20 0000  metroNIDAZOLE (FLAGYL) 500 MG tablet        500 mg Oral 2 times daily 06/22/20 2157 06/29/20 2359         Subjective: No new complaints  Objective: Vitals:   06/23/20 1415 06/23/20 2011 06/24/20 0408 06/24/20 1330  BP: (!) 120/59 (!) 120/56 (!) 114/57 115/64  Pulse: 87 73 73 86  Resp: 20 16 16 20   Temp: 98.3 F (36.8 C) 98.9 F (37.2 C) (!) 97.5 F (36.4 C) 97.6 F (36.4 C)  TempSrc: Oral Oral Oral   SpO2: 96% 97% 95% 99%  Weight:      Height:        Intake/Output Summary (Last 24 hours) at 06/24/2020 1458 Last data filed at 06/24/2020 0900 Gross per 24 hour  Intake 240 ml  Output 500 ml  Net -260 ml   Filed Weights   06/22/20 1816  Weight: 61.7 kg    Examination:  General: No acute distress. Cardiovascular: RRR Lungs: unlabored Abdomen: Soft, nontender, nondistended Neurological: Alert and oriented 3. Moves all extremities 4. Cranial nerves II through XII grossly intact. Skin: Warm and dry. No rashes or lesions. Extremities: No clubbing or cyanosis. No edema.    Data Reviewed: I have personally reviewed following labs and imaging studies  CBC: Recent Labs  Lab 06/22/20 1753 06/23/20 0229 06/24/20 0601  WBC 17.6* 17.7* 9.0  NEUTROABS 15.0* 14.4* 6.5  HGB 11.3* 10.3* 9.5*  HCT 35.8* 33.5* 30.4*  MCV 96.0 98.5 97.7  PLT 330 293 854    Basic Metabolic Panel: Recent Labs  Lab 06/22/20 1753 06/23/20 0229 06/24/20 0601  NA 133* 135 136  K 4.0 3.5 3.2*  CL 100 106 108  CO2 24 21* 20*  GLUCOSE 132* 142* 128*  BUN 14 10 8   CREATININE 0.58 0.56 0.57  CALCIUM 8.6* 8.5* 8.2*  MG   --  1.7 1.9  PHOS  --   --  2.8    GFR: Estimated Creatinine Clearance: 45.7 mL/min (by C-G formula based on SCr of 0.57 mg/dL).  Liver Function Tests: Recent Labs  Lab 06/22/20 1753 06/23/20 0229 06/24/20 0601  AST 30 17 14*  ALT 17 13 11   ALKPHOS 82 74 61  BILITOT 1.0 0.7 0.4  PROT 6.6 5.8* 5.1*  ALBUMIN 3.3* 2.9* 2.5*    CBG: No results for input(s): GLUCAP  in the last 168 hours.   Recent Results (from the past 240 hour(s))  Resp Panel by RT-PCR (Flu Reannon Candella&B, Covid) Nasopharyngeal Swab     Status: None   Collection Time: 06/22/20  9:40 PM   Specimen: Nasopharyngeal Swab; Nasopharyngeal(NP) swabs in vial transport medium  Result Value Ref Range Status   SARS Coronavirus 2 by RT PCR NEGATIVE NEGATIVE Final    Comment: (NOTE) SARS-CoV-2 target nucleic acids are NOT DETECTED.  The SARS-CoV-2 RNA is generally detectable in upper respiratory specimens during the acute phase of infection. The lowest concentration of SARS-CoV-2 viral copies this assay can detect is 138 copies/mL. Andelyn Spade negative result does not preclude SARS-Cov-2 infection and should not be used as the sole basis for treatment or other patient management decisions. Prisma Decarlo negative result may occur with  improper specimen collection/handling, submission of specimen other than nasopharyngeal swab, presence of viral mutation(s) within the areas targeted by this assay, and inadequate number of viral copies(<138 copies/mL). Sondi Desch negative result must be combined with clinical observations, patient history, and epidemiological information. The expected result is Negative.  Fact Sheet for Patients:  EntrepreneurPulse.com.au  Fact Sheet for Healthcare Providers:  IncredibleEmployment.be  This test is no t yet approved or cleared by the [place] FDA and  has been authorized for detection and/or diagnosis of SARS-CoV-2 by FDA under an Emergency Use Authorization (EUA). This EUA will  remain  in effect (meaning this test can be used) for the duration of the COVID-19 declaration under Section 564(b)(1) of the Act, 21 U.S.C.section 360bbb-3(b)(1), unless the authorization is terminated  or revoked sooner.       Influenza Kanisha Duba by PCR NEGATIVE NEGATIVE Final   Influenza B by PCR NEGATIVE NEGATIVE Final    Comment: (NOTE) The Xpert Xpress SARS-CoV-2/FLU/RSV plus assay is intended as an aid in the diagnosis of influenza from Nasopharyngeal swab specimens and should not be used as Leonetta Mcgivern sole basis for treatment. Nasal washings and aspirates are unacceptable for Xpert Xpress SARS-CoV-2/FLU/RSV testing.  Fact Sheet for Patients: EntrepreneurPulse.com.au  Fact Sheet for Healthcare Providers: IncredibleEmployment.be  This test is not yet approved or cleared by the [place] FDA and has been authorized for detection and/or diagnosis of SARS-CoV-2 by FDA under an Emergency Use Authorization (EUA). This EUA will remain in effect (meaning this test can be used) for the duration of the COVID-19 declaration under Section 564(b)(1) of the Act, 21 U.S.C. section 360bbb-3(b)(1), unless the authorization is terminated or revoked.  Performed at [facility], [place] [address] [place], [place] [address]   Culture, blood (routine x 2)     Status: None (Preliminary result)   Collection Time: 06/23/20  2:29 AM   Specimen: BLOOD  Result Value Ref Range Status   Specimen Description   Final    BLOOD RIGHT ANTECUBITAL Performed at [facility] [address], [place], [place] [address]    Special Requests   Final    BOTTLES DRAWN AEROBIC ONLY Blood Culture adequate volume Performed at [facility] [address], [place], [place] [address]    Culture   Final    NO GROWTH 1 DAY Performed at [facility], [place] [address], [place],  [address]    Report Status PENDING  Incomplete   Culture, blood (routine x 2)     Status: None (Preliminary result)   Collection Time: 06/23/20  2:29 AM   Specimen: BLOOD  Result Value Ref Range Status  Specimen Description   Final    BLOOD BLOOD RIGHT FOREARM Performed at [facility] [address] [place], [place] [address]    Special Requests   Final    BOTTLES DRAWN AEROBIC ONLY Blood Culture adequate volume Performed at [facility] [address] [place], [place] [address]    Culture   Final    NO GROWTH 1 DAY Performed at [facility], [place] [address] [place], [place] [address]    Report Status PENDING  Incomplete         Radiology Studies: CT ABDOMEN PELVIS W CONTRAST  Result Date: 06/22/2020 CLINICAL DATA:  Diverticulitis suspected RLQ pain Patient reports weakness and diarrhea. Shoulder surgery 2 weeks ago. EXAM: CT ABDOMEN AND PELVIS WITH CONTRAST TECHNIQUE: Multidetector CT imaging of the abdomen and pelvis was performed using the standard protocol following bolus administration of intravenous contrast. CONTRAST:  161mL OMNIPAQUE IOHEXOL 300 MG/ML  SOLN COMPARISON:  None. FINDINGS: Lower chest: Hypoventilatory changes in the lung bases. The heart is normal in size with coronary artery calcifications. Patulous distal esophagus. Hepatobiliary: Subcentimeter enhancing focus in the right hepatic lobe, series 3, image 18, may be flash filling hemangioma or AP shunt. There is left greater than right intrahepatic biliary ductal dilatation. Proximal common bile duct is mildly dilated measuring 9 mm, with questionable intraluminal density, series 3, image 35. Physiologically distended gallbladder. Fundal gallbladder wall calcifications as well as mild wall thickening, series 3, image 39. No pericholecystic fat stranding. Pancreas: No ductal dilatation or inflammation. Spleen: Normal in size without focal abnormality. Adrenals/Urinary Tract: Normal adrenal glands. No hydronephrosis or  perinephric edema. Homogeneous renal enhancement with symmetric excretion on delayed phase imaging. Multiple small low-density lesions scattered throughout the left greater than right renal parenchyma are subcentimeter and too small to characterize. Urinary bladder is distended without wall thickening. Stomach/Bowel: Patulous distal esophagus decompressed stomach. Few fluid-filled small bowel loops in the pelvis without abnormal distension. Mild fecalization of distal ileal bowel loops. There is Kyrese Gartman blind-ending tubular structure in the right lower quadrant that is dilated at 17 mm and contains internal low-density. No wall hyperemia, wall thickening, or adjacent inflammation. Colonic wall thickening with mild colonic hyperemia involving the mid sigmoid colon, series 3, image 53. May be additional wall thickening involving the descending colon is nondistended limiting assessment. Moderate stool in the proximal as well as sigmoid colon. No significant diverticular disease. Vascular/Lymphatic: Moderate aortic atherosclerosis. No aortic aneurysm. The portal vein is patent. No enlarged lymph nodes in the abdomen or pelvis. There is no ileocolic adenopathy. Reproductive: Status post hysterectomy. No adnexal masses. Other: Trace free fluid in the pelvis which may be reactive. No upper abdominal ascites. No free air or focal fluid collection. There is Belinda Bringhurst tiny fat containing umbilical hernia. Musculoskeletal: Scoliosis and multilevel degenerative change in the spine. There are no acute or suspicious osseous abnormalities. IMPRESSION: 1. Colonic wall thickening with mild colonic hyperemia involving the mid sigmoid colon, suspicious for colitis. There may be additional wall thickening involving the descending colon, which is nondistended limiting assessment. No significant diverticular disease. 2. Dilated blind-ending tubular structure in the right lower quadrant measuring 17 mm contains internal low-density, without wall  thickening, wall thickening, or adjacent inflammation. This is suspicious for an appendix mucocele. Recommend surgical consultation. 3. Mild biliary dilatation with questionable intraluminal density in the mid common bile duct. Recommend correlation with LFTs. MRCP is recommended only if patient is able to tolerate breath hold technique. 4.  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 There is also mild gallbladder wall thickening. Aortic Atherosclerosis (ICD10-I70.0). Electronically Signed   By: Keith Rake M.D.   On: 06/22/2020 19:52   DG Chest Port 1 View  Result Date: 06/22/2020 CLINICAL DATA:  Weakness and diarrhea. Left shoulder surgery 2 weeks ago. EXAM: PORTABLE CHEST 1 VIEW COMPARISON:  None. FINDINGS: The cardiomediastinal contours are normal. Mild aortic atherosclerosis. The lungs are clear. Pulmonary vasculature is normal. No consolidation, pleural effusion, or pneumothorax. Reverse left shoulder arthroplasty, intact were visualized. No acute osseous abnormalities are seen. IMPRESSION: 1. No acute chest findings. 2. Reverse left shoulder arthroplasty, intact were visualized. 3.  Aortic Atherosclerosis (ICD10-I70.0). Electronically Signed   By: Keith Rake M.D.   On: 06/22/2020 18:14   US Abdomen Limited RUQ (LIVER/GB)  Result Date: 06/23/2020 CLINICAL DATA:  Mild biliary ductal dilatation with Jenayah Antu questionable intraluminal density in the common duct on an abdomen and pelvis CT obtained yesterday. There were also fundal gallbladder wall calcifications and Jaylan Duggar subcentimeter enhancing focus in the right hepatic lobe. Also demonstrated was mild gallbladder wall thickening. EXAM: ULTRASOUND ABDOMEN LIMITED RIGHT UPPER QUADRANT COMPARISON:  Abdomen and pelvis CT obtained yesterday. FINDINGS: Gallbladder: Fundal wall thickening and calcifications. Anterior hypoechoic mass-like area and extending the length of the gallbladder with Kiven Vangilder maximum thickness of 1.3 cm.  Possible multiple small noncalcified stones in the gallbladder neck. Common bile duct: Diameter: 5.5 mm. Not well visualized distally. No intraluminal calculus or mass visualized. Liver: Normal echotexture. No sonographically visible mass. Mild intrahepatic ductal dilatation. Portal vein is patent on color Doppler imaging with normal direction of blood flow towards the liver. Other: None. IMPRESSION: 1. Fundal gallbladder wall thickening with calcifications and possible multiple small noncalcified stones in the gallbladder neck. There is also extensive anterior mildly hypoechoic mass-like tissue in the gallbladder, concerning for Nala Kachel possible gallbladder mass such as gallbladder carcinoma. Inspissated sludge is also Tanee Henery possibility. 2. Mild intrahepatic biliary ductal dilatation with no sonographically visible obstructing mass or calculus. 3. No sonographic abnormality visualized to account for the subcentimeter enhancing focus seen in the right hepatic lobe on the CT. Electronically Signed   By: Claudie Revering M.D.   On: 06/23/2020 20:24        Scheduled Meds: . buPROPion  150 mg Oral Daily  . clonazePAM  1 mg Oral QHS  . enoxaparin (LOVENOX) injection  40 mg Subcutaneous Q24H  . lisinopril  5 mg Oral Daily  . sertraline  100 mg Oral Daily   Continuous Infusions: . cefTRIAXone (ROCEPHIN)  IV 2 g (06/24/20 1409)  . metronidazole 500 mg (06/24/20 1058)     LOS: 1 day    Time spent: over 30 min    Fayrene Helper, MD [facility]   To contact the attending provider between 7A-7P or the covering provider during after hours 7P-7A, please log into the web site [email or web address] and access using universal  password for that web site. If you do not have the password, please call the hospital operator.  06/24/2020, 2:58 PM

## 2020-06-24 NOTE — Evaluation (Signed)
Occupational Therapy Evaluation Patient Details Name: Lisa Morton MRN: [id number] DOB: 05/11/1940 Today's Date: 06/24/2020    History of Present Illness 80 year old female with past medical history of hypertension, depression, anxiety disorder, hyperlipidemia, diastolic congestive heart failure (Echo 12/2017 EF 65-70% G1DD) who presents to [facility] emergency department with complaints of diarrhea and generalized weakness. CT findings were concerning for colitis. Also had L reverse TSA on 3/21 after humeral fracture.   Clinical Impression   Lisa Morton is an 80 year old woman who presents with generalized weakness, decreased activity tolerance decreased ROM, strength and functional use of LUE from recent shoulder surgery and impaired balance resulting in a decline in ability to safely ambulate and perform ADLs. Prior to admission patient had intermittent assistance from family and CNA that came once a week to assist with household chores. Reports needing increased time to perform all tasks. Patient needing min assist to transfer out of bed, min guard for ambulation, min assist for UB bathing, min assist for LB dressing, min guard for toileting and mod assist for UB dressing. Therapist had to reiterate shoulder precautions and exercises with patient. Patient's posture kyphotic, shoulders appeared to be elevated and anteriorly positioned and scapulas lacking ROM and tight (unable to significantly mobilize even with hands on joint mobs). Therapist had patient perform lower arm ROM exercises and assisted patient with pendulum exercises as patient performing incorrectly. Patient will benefit from skilled OT services while in hospital to improve deficits and learn compensatory strategies as needed in order to be modified independent. Recommend HH OT at discharge and have assistance from family.       Follow Up Recommendations  Home health OT;Supervision/Assistance - 24 hour;Follow  surgeon's recommendation for DC plan and follow-up therapies;Other (comment) (24/7 assistance initially)    Equipment Recommendations  None recommended by OT    Recommendations for Other Services       Precautions / Restrictions Precautions Precautions: Fall;Shoulder Type of Shoulder Precautions: AROM elbow, wrist and hand ok, per operative note okay for gentle pendulums, no A/PROM at shoulder (prior prior chart) Shoulder Interventions: Shoulder sling/immobilizer;Off for dressing/bathing/exercises Required Braces or Orthoses: Sling Restrictions Weight Bearing Restrictions: Yes LUE Weight Bearing: Non weight bearing      Mobility Bed Mobility Overal bed mobility: Needs Assistance Bed Mobility: Supine to Sit     Supine to sit: Min assist;HOB elevated          Transfers Overall transfer level: Needs assistance Equipment used: None Transfers: Sit to/from Stand Sit to Stand: Min guard         General transfer comment: min guard for ambulation in room. Patient needing hand hold.    Balance   Sitting-balance support: No upper extremity supported Sitting balance-Leahy Scale: Good     Standing balance support: Single extremity supported Standing balance-Leahy Scale: Fair                             ADL either performed or assessed with clinical judgement   ADL Overall ADL's : Needs assistance/impaired Eating/Feeding: Independent   Grooming: Wash/dry hands;Standing;Min guard   Upper Body Bathing: Minimal assistance;Sitting   Lower Body Bathing: Set up;Sitting/lateral leans   Upper Body Dressing : Moderate assistance;Sitting;Cueing for UE precautions;Cueing for sequencing   Lower Body Dressing: Minimal assistance;Sitting/lateral leans;Sit to/from stand Lower Body Dressing Details (indicate cue type and reason): increased time, assistance for clothing management on left Toilet Transfer: Min Psychiatric nurse Details (  indicate cue type and  reason): holding on to IV pole to ambulate to bathroom. Toileting- Water quality scientist and Hygiene: Min guard;Sitting/lateral lean       Functional mobility during ADLs: Min guard       Vision Patient Visual Report: No change from baseline       Perception     Praxis      Pertinent Vitals/Pain Pain Assessment: Faces Faces Pain Scale: Hurts a little bit Pain Location: L shoulder Pain Descriptors / Indicators: Guarding;Grimacing Pain Intervention(s): Limited activity within patient's tolerance;Monitored during session     Hand Dominance     Extremity/Trunk Assessment Upper Extremity Assessment Upper Extremity Assessment: RUE deficits/detail;LUE deficits/detail RUE Deficits / Details: WFL ROM and strength LUE Deficits / Details: intact ROM of fingers, wrist, forearm and elbow, shoulder immobilized. LUE: Unable to fully assess due to pain;Unable to fully assess due to immobilization LUE Sensation: WNL LUE Coordination: WNL   Lower Extremity Assessment Lower Extremity Assessment: Defer to PT evaluation   Cervical / Trunk Assessment Cervical / Trunk Assessment: Kyphotic   Communication     Cognition Arousal/Alertness: Awake/alert Behavior During Therapy: WFL for tasks assessed/performed Overall Cognitive Status: Within Functional Limits for tasks assessed                                     General Comments       Exercises     Shoulder Instructions      Home Living Family/patient expects to be discharged to:: Private residence Living Arrangements: Alone Available Help at Discharge: Family;Available PRN/intermittently Type of Home: House Home Access: Stairs to enter [employer] of Steps: 4 in the front, 5 in the back   Home Layout: One level     Bathroom Shower/Tub: Teacher, early years/pre: Handicapped height Bathroom Accessibility: Yes   Home Equipment: Cane - quad;Cane - single point;Bedside commode    Additional Comments: Pt will have intermittent assist from neighbors, daughters, and son- in laws who live close by. She can have some daytime and intermittent over night supervision but not 24/7. Pt reports that her daughter does her grocery shopping and that she does not drive anymore. Reports one daughter has been working from ther house      Prior Functioning/Environment Level of Independence: Needs assistance  Gait / Transfers Assistance Needed: cane PRN ADL's / Homemaking Assistance Needed: increased time due to recent shoulder surgery            OT Problem List: Decreased strength;Decreased activity tolerance;Impaired balance (sitting and/or standing);Decreased knowledge of precautions;Pain;Impaired UE functional use;Decreased range of motion      OT Treatment/Interventions: Self-care/ADL training;Therapeutic exercise;Therapeutic activities;Patient/family education;Balance training    OT Goals(Current goals can be found in the care plan section) Acute Rehab OT Goals Patient Stated Goal: regain independence OT Goal Formulation: With patient Time For Goal Achievement: 07/08/20 Potential to Achieve Goals: Good  OT Frequency: Min 2X/week   Barriers to D/C:            Co-evaluation              AM-PAC OT "6 Clicks" Daily Activity     Outcome Measure Help from another person eating meals?: None Help from another person taking care of personal grooming?: A Little Help from another person toileting, which includes using toliet, bedpan, or urinal?: A Little Help from another person bathing (including washing, rinsing, drying)?: A  Little Help from another person to put on and taking off regular upper body clothing?: A Lot Help from another person to put on and taking off regular lower body clothing?: A Little 6 Click Score: 18   End of Session Equipment Utilized During Treatment: Gait belt Nurse Communication: Mobility status  Activity Tolerance: Patient tolerated  treatment well Patient left: in chair;with call bell/phone within reach;with chair alarm set  OT Visit Diagnosis: Unsteadiness on feet (R26.81);Pain;History of falling (Z91.81);Muscle weakness (generalized) (M62.81) Pain - Right/Left: Left Pain - part of body: Shoulder                Time: 7517-0017 OT Time Calculation (min): 47 min Charges:  OT General Charges $OT Visit: 1 Visit OT Evaluation $OT Eval Low Complexity: 1 Low OT Treatments $Self Care/Home Management : 8-22 mins $Therapeutic Exercise: 8-22 mins  Adriann Thau, OTR/L Acute Care Rehab Services  Office [phone] Pager: [place] 06/24/2020, 11:01 AM

## 2020-06-24 NOTE — Progress Notes (Signed)
Nutrition Brief Note RD working remotely.  Patient identified on the Malnutrition Screening Tool (MST) Report; score of 2.0  Wt Readings from Last 15 Encounters:  06/22/20 61.7 kg  06/04/20 61.7 kg  05/05/18 62 kg  03/31/18 61.1 kg  02/04/18 60.8 kg  12/29/17 61.2 kg  07/10/16 56.7 kg  01/26/12 56.7 kg  01/22/12 56.7 kg    Body mass index is 25.7 kg/m. Patient meets criteria for overweight status based on current BMI. Skin WDL.   Current diet order is Soft and she has been eating 100% of all meals since admission. Labs and medications reviewed.   No nutrition interventions warranted at this time. If nutrition issues arise, please consult RD.       Jarome Matin, MS, RD, LDN, CNSC Inpatient Clinical Dietitian RD pager # available in [place]  After hours/weekend pager # available in [facility]

## 2020-06-24 NOTE — Evaluation (Addendum)
Physical Therapy Evaluation Patient Details Name: Lisa Morton MRN: [id number] DOB: 1940-11-16 Today's Date: 06/24/2020   History of Present Illness  80 year old female with past medical history of hypertension, depression, anxiety disorder, hyperlipidemia, diastolic congestive heart failure (Echo 12/2017 EF 65-70% G1DD) who presents to [facility] emergency department with complaints of diarrhea and generalized weakness. CT findings were concerning for colitis. Also had L reverse TSA on 3/21 after humeral fracture.  Clinical Impression  Pt admitted as above and presenting with functional mobility limitations 2* mild generalized weakness and ambulatory balance deficits exacerbated by immobilization of R UE.  Pt very cooperative but limited this eval 2* ongoing bowel urgency and need for bathroom repeatedly.  Pt hopes to progress to dc home with intermittent family assist.    Follow Up Recommendations Home health PT    Equipment Recommendations  None recommended by PT    Recommendations for Other Services       Precautions / Restrictions Precautions Precautions: Fall;Shoulder Type of Shoulder Precautions: AROM elbow, wrist and hand ok, per operative note okay for gentle pendulums, no A/PROM at shoulder (prior prior chart) Shoulder Interventions: Shoulder sling/immobilizer;Off for dressing/bathing/exercises Precaution Booklet Issued: Yes (comment) Required Braces or Orthoses: Sling Restrictions Weight Bearing Restrictions: Yes LUE Weight Bearing: Non weight bearing Other Position/Activity Restrictions: NO AROM/PROM of shoulder; sling at all times      Mobility  Bed Mobility Overal bed mobility: Needs Assistance Bed Mobility: Supine to Sit     Supine to sit: Min assist;HOB elevated     General bed mobility comments: OOB in recliner    Transfers Overall transfer level: Needs assistance Equipment used: None Transfers: Sit to/from Stand Sit to Stand: Min  guard Stand pivot transfers: Min guard;Min assist       General transfer comment: steady assist to stand; min assist to stand pvt to [facility]  Ambulation/Gait Ambulation/Gait assistance: Min guard;Min assist Gait Distance (Feet): 15 Feet Assistive device: IV Pole Gait Pattern/deviations: Step-through pattern;Drifts right/left Gait velocity: decreased   General Gait Details: Pt ambulated limited distance to bathroom  Stairs            Wheelchair Mobility    Modified Rankin (Stroke Patients Only)       Balance Overall balance assessment: Needs assistance Sitting-balance support: No upper extremity supported Sitting balance-Leahy Scale: Good     Standing balance support: Single extremity supported;No upper extremity supported Standing balance-Leahy Scale: Fair Standing balance comment: pt was able to maintain static standing balance without UE support and assist from therapist                             Pertinent Vitals/Pain Pain Assessment: Faces Faces Pain Scale: Hurts a little bit Pain Location: L shoulder Pain Descriptors / Indicators: Guarding;Grimacing Pain Intervention(s): Limited activity within patient's tolerance;Monitored during session    [place] expects to be discharged to:: Private residence Living Arrangements: Alone Available Help at Discharge: Family;Available PRN/intermittently Type of Home: House Home Access: Stairs to enter Entrance Stairs-Rails: Psychiatric nurse of Steps: 4 in the front, 5 in the back Home Layout: One level Home Equipment: Cane - quad;Cane - single point;Bedside commode Additional Comments: Pt will have intermittent assist from neighbors, daughters, and son- in laws who live close by. She can have some daytime and intermittent over night supervision but not 24/7. Pt reports that her daughter does her grocery shopping and that she does not drive anymore. Reports one  daughter has  been working from ther house    Prior Function Level of Independence: Needs assistance   Gait / Transfers Assistance Needed: cane PRN  ADL's / Homemaking Assistance Needed: increased time due to recent shoulder surgery  Comments: Pt states balance and independence slowly improving since shoulder surgery     Hand Dominance   Dominant Hand: Right    Extremity/Trunk Assessment   Upper Extremity Assessment Upper Extremity Assessment: Defer to OT evaluation RUE Deficits / Details: WFL ROM and strength LUE Deficits / Details: intact ROM of fingers, wrist, forearm and elbow, shoulder immobilized. LUE: Unable to fully assess due to immobilization LUE Sensation: WNL LUE Coordination: WNL    Lower Extremity Assessment Lower Extremity Assessment: Overall WFL for tasks assessed    Cervical / Trunk Assessment Cervical / Trunk Assessment: Kyphotic  Communication   Communication: No difficulties  Cognition Arousal/Alertness: Awake/alert Behavior During Therapy: WFL for tasks assessed/performed Overall Cognitive Status: Within Functional Limits for tasks assessed                                 General Comments: AxO x 3 very pleasant      General Comments      Exercises     Assessment/Plan    PT Assessment Patient needs continued PT services  PT Problem List Decreased strength;Decreased activity tolerance;Decreased balance;Decreased mobility;Decreased knowledge of use of DME       PT Treatment Interventions DME instruction;Gait training;Stair training;Functional mobility training;Therapeutic activities;Therapeutic exercise;Balance training;Patient/family education    PT Goals (Current goals can be found in the Care Plan section)  Acute Rehab PT Goals Patient Stated Goal: regain independence PT Goal Formulation: With patient Time For Goal Achievement: 06/11/20 Potential to Achieve Goals: Good    Frequency Min 3X/week   Barriers to discharge Decreased  caregiver support Does not have 24/7 assist    Co-evaluation               AM-PAC PT "6 Clicks" Mobility  Outcome Measure Help needed turning from your back to your side while in a flat bed without using bedrails?: A Little Help needed moving from lying on your back to sitting on the side of a flat bed without using bedrails?: A Little Help needed moving to and from a bed to a chair (including a wheelchair)?: A Little Help needed standing up from a chair using your arms (e.g., wheelchair or bedside chair)?: A Little Help needed to walk in hospital room?: A Little Help needed climbing 3-5 steps with a railing? : A Lot 6 Click Score: 17    End of Session Equipment Utilized During Treatment: Gait belt Activity Tolerance: Patient tolerated treatment well Patient left: Other (comment) (in bathroom on comode) Nurse Communication: Mobility status PT Visit Diagnosis: Unsteadiness on feet (R26.81);History of falling (Z91.81)    Time: 4034-7425 PT Time Calculation (min) (ACUTE ONLY): 22 min   Charges:   PT Evaluation $PT Eval Low Complexity: 1 Low          [place] Pager [phone] Office [phone]   Holland Nickson 06/24/2020, 1:06 PM

## 2020-06-24 NOTE — Consult Note (Addendum)
[facility] Consult  Referring Provider: Dr. Lonia Blood hospitalist Primary Care Physician:  Maurice Small, MD Primary Gastroenterologist: Previous patient of Dr. Ellender Hose GI  Reason for Consultation: Colitis, abnormal imaging of appendix and gallbladder  HPI: Lisa Morton is a 80 y.o. female was admitted on 06/22/2020 after she was brought to the ER by her daughter due to lower abdominal pain, fatigue, weakness and multiple episodes of diarrhea. Most of the history is obtained from the patient's daughter who states that on Friday, patient had several episodes of diarrhea and complained of feeling sick to her stomach and was brought to the ER where she subsequently had a CAT scan of the abdomen and pelvis which showed Proximal CBD dilated to 9 mm, questionable intraluminal density in mid common bile duct Physiologically distended gallbladder, consistent with porcelain gallbladder Fundal gallbladder wall calcification Wall thickening of sigmoid and descending, suspicious for colitis 17 mm dilated blind-ending tubular structure, possible appendiceal mucocele Patient was subsequently started on IV antibiotics, Cipro and Flagyl.  Patient's last colonoscopy was by Dr. Amedeo Plenty in 2007 which was reported as normal. Patient denies acid reflux, heartburn, difficulty swallowing or pain on swallowing. Normally patient has regular bowel movements, however for the last few days she has been having multiple episodes of liquid and loose stools, which have become softer today. She denies noticing blood in stool, black stools or mucus in stools. She denies unintentional weight loss. She denies fever, nausea vomiting.  Past Medical History:  Diagnosis Date  . Chronic diastolic CHF (congestive heart failure) ([place]) 06/23/2020  . Closed fracture of left proximal humerus 06/04/2020  . Depression   . GERD (gastroesophageal reflux disease)    Rare  . History of migraine   . History of syncope    Full  negative cardiac workup  . Hyperlipemia   . Hypertension   . Numbness    Left heel, sharp itching pain  . OA (osteoarthritis)    lumbar spine, left hip  . Pre-diabetes   . Restless leg syndrome    on klonidipin    Past Surgical History:  Procedure Laterality Date  . ABDOMINAL HYSTERECTOMY  1984  . CATARACT EXTRACTION  07/2011  . CESAREAN SECTION     x2  . COLONOSCOPY    . CYSTOCELE REPAIR  01/26/2012   Procedure: ANTERIOR REPAIR (CYSTOCELE);  Surgeon: Thurnell Lose, MD;  Location: [facility];  Service: Gynecology;  Laterality: N/A;  . REVERSE SHOULDER ARTHROPLASTY Left 06/04/2020   Procedure: REVERSE SHOULDER ARTHROPLASTY;  Surgeon: Verner Mould, MD;  Location: [facility];  Service: Orthopedics;  Laterality: Left;  with block    Prior to Admission medications   Medication Sig Start Date End Date Taking? Authorizing Provider  buPROPion (WELLBUTRIN XL) 150 MG 24 hr tablet Take 150 mg by mouth daily.   Yes [provider]  Calcium-Magnesium-Zinc (CAL-MAG-ZINC PO) Take 1 tablet by mouth daily.   Yes [provider]  Cholecalciferol (VITAMIN D) 50 MCG (2000 UT) CAPS Take 2,000 Units by mouth daily.   Yes [provider]  ciprofloxacin (CIPRO) 500 MG tablet Take 1 tablet (500 mg total) by mouth 2 (two) times daily. 06/22/20  Yes Carmin Muskrat, MD  clonazePAM (KLONOPIN) 1 MG tablet Take 1 mg by mouth at bedtime.    Yes [provider]  HYDROcodone-acetaminophen (NORCO/VICODIN) 5-325 MG tablet Take 1-2 tablets by mouth every 4 (four) hours as needed for moderate pain (pain score 4-6). 06/05/20  Yes Verner Mould, MD  lisinopril (  PRINIVIL,ZESTRIL) 5 MG tablet Take 5 mg by mouth daily.   Yes [provider]  Multiple Vitamins-Minerals (CENTRUM SILVER ADULT 50+ PO) Take 1 tablet by mouth daily.   Yes [provider]  ondansetron (ZOFRAN ODT) 4 MG disintegrating tablet Take 1 tablet (4 mg total) by mouth every 8 (eight) hours as  needed for nausea or vomiting. 06/22/20  Yes Carmin Muskrat, MD  OVER THE COUNTER MEDICATION Take 1 capsule by mouth 3 (three) times a week. Blood Builder- contains Vitamin C, Folate, Vitamin B12, and Iron. Takes on Wed and Sunday.   Yes [provider]  Red Yeast Rice 600 MG CAPS Take 1,200 mg by mouth in the morning and at bedtime.   Yes [provider]  sertraline (ZOLOFT) 100 MG tablet Take 100 mg by mouth daily. 06/30/16  Yes [provider]  vitamin B-12 (CYANOCOBALAMIN) 1000 MCG tablet Take 1,000 mcg by mouth 4 (four) times a week.    Yes [provider]  metroNIDAZOLE (FLAGYL) 500 MG tablet Take 1 tablet (500 mg total) by mouth 2 (two) times daily for 7 days. For ten days 06/22/20 06/29/20  Carmin Muskrat, MD    Current Facility-Administered Medications  Medication Dose Route Frequency Provider Last Rate Last Admin  . acetaminophen (TYLENOL) tablet 650 mg  650 mg Oral Q6H PRN Shalhoub, Sherryll Burger, MD       Or  . acetaminophen (TYLENOL) suppository 650 mg  650 mg Rectal Q6H PRN Vernelle Emerald, MD      . buPROPion (WELLBUTRIN XL) 24 hr tablet 150 mg  150 mg Oral Daily Shalhoub, Sherryll Burger, MD   150 mg at 06/24/20 1059  . cefTRIAXone (ROCEPHIN) 2 g in sodium chloride 0.9 % 100 mL IVPB  2 g Intravenous Q24H Elodia Florence., MD 200 mL/hr at 06/23/20 1521 2 g at 06/23/20 1521  . clonazePAM (KLONOPIN) tablet 1 mg  1 mg Oral QHS Shalhoub, Sherryll Burger, MD   1 mg at 06/23/20 2136  . enoxaparin (LOVENOX) injection 40 mg  40 mg Subcutaneous Q24H Shalhoub, Sherryll Burger, MD   40 mg at 06/24/20 1059  . lisinopril (ZESTRIL) tablet 5 mg  5 mg Oral Daily Shalhoub, Sherryll Burger, MD   5 mg at 06/24/20 1059  . methocarbamol (ROBAXIN) tablet 500 mg  500 mg Oral Q6H PRN Shalhoub, Sherryll Burger, MD      . metroNIDAZOLE (FLAGYL) IVPB 500 mg  500 mg Intravenous Q8H Shalhoub, Sherryll Burger, MD 100 mL/hr at 06/24/20 1058 500 mg at 06/24/20 1058  . ondansetron (ZOFRAN) tablet 4 mg  4 mg Oral Q6H  PRN Shalhoub, Sherryll Burger, MD       Or  . ondansetron [facility]) injection 4 mg  4 mg Intravenous Q6H PRN Shalhoub, Sherryll Burger, MD      . sertraline (ZOLOFT) tablet 100 mg  100 mg Oral Daily Shalhoub, Sherryll Burger, MD   100 mg at 06/24/20 1059    Allergies as of 06/22/2020 - Review Complete 06/22/2020  Allergen Reaction Noted  . Codeine Nausea And Vomiting 01/12/2012  . Latex Rash 05/31/2020  . Penicillins Rash 01/12/2012    Family History  Problem Relation Age of Onset  . Cancer Mother   . Heart attack Father        No details died at age 84  . Bipolar disorder Sister   . Breast cancer Maternal Grandmother     Social History   Socioeconomic History  . Marital status: Married  Spouse name: Not on file  . Number of children: Not on file  . Years of education: Not on file  . Highest education level: Not on file  Occupational History  . Not on file  Tobacco Use  . Smoking status: Never Smoker  . Smokeless tobacco: Never Used  Vaping Use  . Vaping Use: Never used  Substance and Sexual Activity  . Alcohol use: No  . Drug use: No  . Sexual activity: Not on file  Other Topics Concern  . Not on file  Social History Narrative   Lives with husband.  Two children.  4 grands.     Social Determinants of Health   Financial Resource Strain: Not on file  Food Insecurity: Not on file  Transportation Needs: Not on file  Physical Activity: Not on file  Stress: Not on file  Social Connections: Not on file  Intimate Partner Violence: Not on file    Review of Systems: Positive for: GI: Described in detail in HPI.    Gen: anorexia, fatigue, weakness, malaise, denies any fever, chills, rigors, night sweats, involuntary weight loss, and sleep disorder CV: Denies chest pain, angina, palpitations, syncope, orthopnea, PND, peripheral edema, and claudication. Resp: Denies dyspnea, cough, sputum, wheezing, coughing up blood. GU : Denies urinary burning, blood in urine, urinary frequency,  urinary hesitancy, nocturnal urination, and urinary incontinence. MS: Denies joint pain or swelling.  Denies muscle weakness, cramps, atrophy.  Derm: Denies rash, itching, oral ulcerations, hives, unhealing ulcers.  Psych: Denies depression, anxiety, memory loss, suicidal ideation, hallucinations,  and confusion. Heme: Denies bruising, bleeding, and enlarged lymph nodes. Neuro:  Denies any headaches, dizziness, paresthesias. Endo:  Denies any problems with DM, thyroid, adrenal function.  Physical Exam: Vital signs in last 24 hours: Temp:  [97.5 F (36.4 C)-98.9 F (37.2 C)] 97.6 F (36.4 C) (04/10 1330) Pulse Rate:  [73-87] 86 (04/10 1330) Resp:  [16-20] 20 (04/10 1330) BP: (114-120)/(56-64) 115/64 (04/10 1330) SpO2:  [95 %-99 %] 99 % (04/10 1330) Last BM Date: 06/23/20  General:   Alert,  Well-developed, well-nourished, pleasant and cooperative in NAD Head:  Normocephalic and atraumatic. Eyes:  Sclera clear, no icterus.   Mild pallor Ears:  Normal auditory acuity. Nose:  No deformity, discharge,  or lesions. Mouth:  No deformity or lesions.  Oropharynx pink & moist. Neck:  Supple; no masses or thyromegaly. Lungs:  Clear throughout to auscultation.   No wheezes, crackles, or rhonchi. No acute distress. Heart:  Regular rate and rhythm; no murmurs, clicks, rubs,  or gallops. Extremities:  Without clubbing or edema. Neurologic:  Alert and  oriented x4;  grossly normal neurologically. Skin:  Intact without significant lesions or rashes. Psych:  Alert and cooperative. Normal mood and affect. Abdomen:  Soft, nontender and nondistended. No masses, hepatosplenomegaly or hernias noted. Normal bowel sounds, without guarding, and without rebound.         Lab Results: Recent Labs    06/22/20 1753 06/23/20 0229 06/24/20 0601  WBC 17.6* 17.7* 9.0  HGB 11.3* 10.3* 9.5*  HCT 35.8* 33.5* 30.4*  PLT 330 293 238   BMET Recent Labs    06/22/20 1753 06/23/20 0229 06/24/20 0601  NA  133* 135 136  K 4.0 3.5 3.2*  CL 100 106 108  CO2 24 21* 20*  GLUCOSE 132* 142* 128*  BUN 14 10 8   CREATININE 0.58 0.56 0.57  CALCIUM 8.6* 8.5* 8.2*   LFT Recent Labs    06/24/20 0601  PROT 5.1*  ALBUMIN 2.5*  AST 14*  ALT 11  ALKPHOS 61  BILITOT 0.4   PT/INR No results for input(s): LABPROT, INR in the last 72 hours.  Studies/Results: CT ABDOMEN PELVIS W CONTRAST  Result Date: 06/22/2020 CLINICAL DATA:  Diverticulitis suspected RLQ pain Patient reports weakness and diarrhea. Shoulder surgery 2 weeks ago. EXAM: CT ABDOMEN AND PELVIS WITH CONTRAST TECHNIQUE: Multidetector CT imaging of the abdomen and pelvis was performed using the standard protocol following bolus administration of intravenous contrast. CONTRAST:  164mL OMNIPAQUE IOHEXOL 300 MG/ML  SOLN COMPARISON:  None. FINDINGS: Lower chest: Hypoventilatory changes in the lung bases. The heart is normal in size with coronary artery calcifications. Patulous distal esophagus. Hepatobiliary: Subcentimeter enhancing focus in the right hepatic lobe, series 3, image 18, may be flash filling hemangioma or AP shunt. There is left greater than right intrahepatic biliary ductal dilatation. Proximal common bile duct is mildly dilated measuring 9 mm, with questionable intraluminal density, series 3, image 35. Physiologically distended gallbladder. Fundal gallbladder wall calcifications as well as mild wall thickening, series 3, image 39. No pericholecystic fat stranding. Pancreas: No ductal dilatation or inflammation. Spleen: Normal in size without focal abnormality. Adrenals/Urinary Tract: Normal adrenal glands. No hydronephrosis or perinephric edema. Homogeneous renal enhancement with symmetric excretion on delayed phase imaging. Multiple small low-density lesions scattered throughout the left greater than right renal parenchyma are subcentimeter and too small to characterize. Urinary bladder is distended without wall thickening. Stomach/Bowel:  Patulous distal esophagus decompressed stomach. Few fluid-filled small bowel loops in the pelvis without abnormal distension. Mild fecalization of distal ileal bowel loops. There is a blind-ending tubular structure in the right lower quadrant that is dilated at 17 mm and contains internal low-density. No wall hyperemia, wall thickening, or adjacent inflammation. Colonic wall thickening with mild colonic hyperemia involving the mid sigmoid colon, series 3, image 53. May be additional wall thickening involving the descending colon is nondistended limiting assessment. Moderate stool in the proximal as well as sigmoid colon. No significant diverticular disease. Vascular/Lymphatic: Moderate aortic atherosclerosis. No aortic aneurysm. The portal vein is patent. No enlarged lymph nodes in the abdomen or pelvis. There is no ileocolic adenopathy. Reproductive: Status post hysterectomy. No adnexal masses. Other: Trace free fluid in the pelvis which may be reactive. No upper abdominal ascites. No free air or focal fluid collection. There is a tiny fat containing umbilical hernia. Musculoskeletal: Scoliosis and multilevel degenerative change in the spine. There are no acute or suspicious osseous abnormalities. IMPRESSION: 1. Colonic wall thickening with mild colonic hyperemia involving the mid sigmoid colon, suspicious for colitis. There may be additional wall thickening involving the descending colon, which is nondistended limiting assessment. No significant diverticular disease. 2. Dilated blind-ending tubular structure in the right lower quadrant measuring 17 mm contains internal low-density, without wall thickening, wall thickening, or adjacent inflammation. This is suspicious for an appendix mucocele. Recommend surgical consultation. 3. Mild biliary dilatation with questionable intraluminal density in the mid common bile duct. Recommend correlation with LFTs. MRCP is recommended only if patient is able to tolerate breath  hold technique. 4. 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 There is also mild gallbladder wall thickening. Aortic Atherosclerosis (ICD10-I70.0). Electronically Signed   By: Keith Rake M.D.   On: 06/22/2020 19:52   DG Chest Port 1 View  Result Date: 06/22/2020 CLINICAL DATA:  Weakness and diarrhea. Left shoulder surgery 2 weeks ago. EXAM: PORTABLE CHEST 1 VIEW COMPARISON:  None. FINDINGS: The cardiomediastinal contours are normal. Mild aortic atherosclerosis.  The lungs are clear. Pulmonary vasculature is normal. No consolidation, pleural effusion, or pneumothorax. Reverse left shoulder arthroplasty, intact were visualized. No acute osseous abnormalities are seen. IMPRESSION: 1. No acute chest findings. 2. Reverse left shoulder arthroplasty, intact were visualized. 3.  Aortic Atherosclerosis (ICD10-I70.0). Electronically Signed   By: Keith Rake M.D.   On: 06/22/2020 18:14   US Abdomen Limited RUQ (LIVER/GB)  Result Date: 06/23/2020 CLINICAL DATA:  Mild biliary ductal dilatation with a questionable intraluminal density in the common duct on an abdomen and pelvis CT obtained yesterday. There were also fundal gallbladder wall calcifications and a subcentimeter enhancing focus in the right hepatic lobe. Also demonstrated was mild gallbladder wall thickening. EXAM: ULTRASOUND ABDOMEN LIMITED RIGHT UPPER QUADRANT COMPARISON:  Abdomen and pelvis CT obtained yesterday. FINDINGS: Gallbladder: Fundal wall thickening and calcifications. Anterior hypoechoic mass-like area and extending the length of the gallbladder with a maximum thickness of 1.3 cm. Possible multiple small noncalcified stones in the gallbladder neck. Common bile duct: Diameter: 5.5 mm. Not well visualized distally. No intraluminal calculus or mass visualized. Liver: Normal echotexture. No sonographically visible mass. Mild intrahepatic ductal dilatation. Portal vein is patent on color Doppler imaging with  normal direction of blood flow towards the liver. Other: None. IMPRESSION: 1. Fundal gallbladder wall thickening with calcifications and possible multiple small noncalcified stones in the gallbladder neck. There is also extensive anterior mildly hypoechoic mass-like tissue in the gallbladder, concerning for a possible gallbladder mass such as gallbladder carcinoma. Inspissated sludge is also a possibility. 2. Mild intrahepatic biliary ductal dilatation with no sonographically visible obstructing mass or calculus. 3. No sonographic abnormality visualized to account for the subcentimeter enhancing focus seen in the right hepatic lobe on the CT. Electronically Signed   By: Claudie Revering M.D.   On: 06/23/2020 20:24    Impression: Abdominal pain, thickening of sigmoid and descending colon compatible with colitis on CAT scan  Calcification of gallbladder, porcelain gallbladder Dilated biliary tree with normal LFTs, T bili 0.4/AST 14/ALT 11/ALP 61  Possible appendiceal mucocele, as per surgery outpatient follow-up recommended but they recommended colonoscopy prior to consideration for surgery for appendiceal mucocele  Hypokalemia, mild acidosis, hypoalbuminemia, elevated CRP  Plan: Stool for GI pathogen panel pending, on empiric antibiotics, marked improvement in leukocytosis from 17.6 on admission to 9 today  Normocytic anemia-continue monitoring Patient tolerating soft diet MRI/MRCP to be ordered as an inpatient for further evaluation of biliary tree and gallbladder Depending upon GI pathogen panel patient may need a colonoscopy as an outpatient( if negative). Discussed the same with patient's hospitalist Dr. Florene Glen as well as the patient and her daughter at bedside.   LOS: 1 day   Ronnette Juniper, MD  06/24/2020, 1:36 PM

## 2020-06-24 NOTE — TOC Initial Note (Addendum)
Transition of Care [facility]) - Initial/Assessment Note    Patient Details  Name: Lisa Morton MRN: [id number] Date of Birth: 16-Jun-1940  [facility] CM/SW Contact:    Joaquin Courts, RN Phone Number: 06/24/2020, 4:43 PM  Clinical Narrative:                 CM spoke with patient regarding therapy recommendations, patient is in agreement with [facility] services.  CM reached out to area [facility] providers with the following results: [facility] declined [facility]- declined [facility]- no response [facility]- declined [facility]- declined [facility]- unable to reach rep [facility]- per central intake able to provide HHPT only, md notified.  Orders faxed to central intake.    Expected Discharge Plan: [place] Barriers to Discharge: No Barriers Identified   Patient Goals and CMS Choice Patient states their goals for this hospitalization and ongoing recovery are:: to  go home with therapy CMS Medicare.gov Compare Post Acute Care list provided to:: Patient Choice offered to / list presented to : Patient  Expected Discharge Plan and Services Expected Discharge Plan: [place]   Discharge Planning Services: CM Consult Post Acute Care Choice: [place] arrangements for the past 2 months: Single Family Home                           HH Arranged: PT [place]: [facility] Date [place]: 06/24/20 Time HH Agency Contacted: 1642 Representative spoke with at [place]: central intake  Prior Living Arrangements/Services Living arrangements for the past 2 months: [place]   Patient language and need for interpreter reviewed:: Yes Do you feel safe going back to the place where you live?: Yes            Criminal Activity/Legal Involvement Pertinent to Current Situation/Hospitalization: No - Comment as needed  Activities of Daily Living Home Assistive Devices/Equipment: Sling (specify type),Eyeglasses (left arm sling) ADL  Screening (condition at time of admission) Patient's cognitive ability adequate to safely complete daily activities?: Yes Is the patient deaf or have difficulty hearing?: No Does the patient have difficulty seeing, even when wearing glasses/contacts?: No Does the patient have difficulty concentrating, remembering, or making decisions?: No Patient able to express need for assistance with ADLs?: Yes Does the patient have difficulty dressing or bathing?: No Independently performs ADLs?: Yes (appropriate for developmental age) Does the patient have difficulty walking or climbing stairs?: Yes Weakness of Legs: None Weakness of Arms/Hands: Left  Permission Sought/Granted                  Emotional Assessment Appearance:: Appears stated age     Orientation: : Oriented to Self,Oriented to Place,Oriented to  Time,Oriented to Situation   Psych Involvement: No (comment)  Admission diagnosis:  Colitis [K52.9] SIRS (systemic inflammatory response syndrome) ([place]) [R65.10] Acute colitis [K52.9] Patient Active Problem List   Diagnosis Date Noted  . Sepsis ([place]) 06/23/2020  . Chronic diastolic CHF (congestive heart failure) ([place]) 06/23/2020  . Colitis 06/23/2020  . Acute colitis 06/22/2020  . Anxiety and depression 06/05/2020  . Essential hypertension 05/05/2018  . Cystocele 01/27/2012   PCP:  Maurice Small, MD Pharmacy:   [facility] [place] [address], [place] [facility] [address] [place] [address] Phone: [phone] Fax: [phone]     Social Determinants of Health ([place]) Interventions    Readmission Risk Interventions No flowsheet data found.

## 2020-06-25 LAB — COMPREHENSIVE METABOLIC PANEL
ALT: 11 U/L (ref 0–44)
AST: 12 U/L — ABNORMAL LOW (ref 15–41)
Albumin: 2.7 g/dL — ABNORMAL LOW (ref 3.5–5.0)
Alkaline Phosphatase: 59 U/L (ref 38–126)
Anion gap: 8 (ref 5–15)
BUN: 8 mg/dL (ref 8–23)
CO2: 21 mmol/L — ABNORMAL LOW (ref 22–32)
Calcium: 8.4 mg/dL — ABNORMAL LOW (ref 8.9–10.3)
Chloride: 110 mmol/L (ref 98–111)
Creatinine, Ser: 0.44 mg/dL (ref 0.44–1.00)
GFR, Estimated: >60 mL/min (ref >60)
Glucose, Bld: 110 mg/dL — ABNORMAL HIGH (ref 70–99)
Potassium: 3.6 mmol/L (ref 3.5–5.1)
Sodium: 139 mmol/L (ref 135–145)
Total Bilirubin: 0.2 mg/dL — ABNORMAL LOW (ref 0.3–1.2)
Total Protein: 5.4 g/dL — ABNORMAL LOW (ref 6.5–8.1)

## 2020-06-25 LAB — CBC WITH DIFFERENTIAL/PLATELET
Abs Immature Granulocytes: 0.03 10*3/uL (ref 0.00–0.07)
Basophils Absolute: 0.1 10*3/uL (ref 0.0–0.1)
Basophils Relative: 1 %
Eosinophils Absolute: 0.4 10*3/uL (ref 0.0–0.5)
Eosinophils Relative: 5 %
HCT: 33 % — ABNORMAL LOW (ref 36.0–46.0)
Hemoglobin: 10.2 g/dL — ABNORMAL LOW (ref 12.0–15.0)
Immature Granulocytes: 0 %
Lymphocytes Relative: 21 %
Lymphs Abs: 1.5 10*3/uL (ref 0.7–4.0)
MCH: 30 pg (ref 26.0–34.0)
MCHC: 30.9 g/dL (ref 30.0–36.0)
MCV: 97.1 fL (ref 80.0–100.0)
Monocytes Absolute: 0.6 10*3/uL (ref 0.1–1.0)
Monocytes Relative: 8 %
Neutro Abs: 4.8 10*3/uL (ref 1.7–7.7)
Neutrophils Relative %: 65 %
Platelets: 266 10*3/uL (ref 150–400)
RBC: 3.4 MIL/uL — ABNORMAL LOW (ref 3.87–5.11)
RDW: 13.5 % (ref 11.5–15.5)
WBC: 7.4 10*3/uL (ref 4.0–10.5)
nRBC: 0 % (ref 0.0–0.2)

## 2020-06-25 LAB — GASTROINTESTINAL PANEL BY PCR, STOOL (REPLACES STOOL CULTURE)

## 2020-06-25 LAB — MAGNESIUM: Magnesium: 1.8 mg/dL (ref 1.7–2.4)

## 2020-06-25 LAB — PHOSPHORUS: Phosphorus: 3.3 mg/dL (ref 2.5–4.6)

## 2020-06-25 NOTE — Progress Notes (Signed)
[facility] Gastroenterology Progress Note  Lisa Morton 80 y.o. 23-Jul-1940  CC:  Diarrhea   Subjective: Patient reports a large loose/liquid BM today.  Denies rectal bleeding or melena.  Had a formed stool yesterday, which tested negative for GI pathogens, though C diff was unable to be resulted as stool was formed.  She has mild lower abdominal discomfort.  She tolerated a diet today and denies nausea/vomiting.  ROS : Review of Systems  Cardiovascular: Negative for chest pain and palpitations.  Gastrointestinal: Positive for abdominal pain and diarrhea. Negative for blood in stool, constipation, heartburn, melena, nausea and vomiting.    Objective: Vital signs in last 24 hours: Vitals:   06/24/20 2017 06/25/20 0426  BP: 128/60 139/67  Pulse: 88 73  Resp: 16 20  Temp: 98.2 F (36.8 C) (!) 97.5 F (36.4 C)  SpO2: 100% 96%    Physical Exam:  General:  Alert, cooperative, no distress, appears stated age  Head:  Normocephalic, without obvious abnormality, atraumatic  Eyes:  Anicteric sclera, EOMs intact  Lungs:   Clear to auscultation bilaterally, respirations unlabored  Heart:  Regular rate and rhythm, S1, S2 normal  Abdomen:   Soft, non-tender, non-distended, bowel sounds active all four quadrants,  no guarding or peritoneal signs  Extremities: Extremities normal, atraumatic, no  edema  Pulses: 2+ and symmetric    Lab Results: Recent Labs    06/24/20 0601 06/25/20 0711  NA 136 139  K 3.2* 3.6  CL 108 110  CO2 20* 21*  GLUCOSE 128* 110*  BUN 8 8  CREATININE 0.57 0.44  CALCIUM 8.2* 8.4*  MG 1.9 1.8  PHOS 2.8 3.3   Recent Labs    06/24/20 0601 06/25/20 0711  AST 14* 12*  ALT 11 11  ALKPHOS 61 59  BILITOT 0.4 0.2*  PROT 5.1* 5.4*  ALBUMIN 2.5* 2.7*   Recent Labs    06/24/20 0601 06/25/20 0711  WBC 9.0 7.4  NEUTROABS 6.5 4.8  HGB 9.5* 10.2*  HCT 30.4* 33.0*  MCV 97.7 97.1  PLT 238 266   No results for input(s): LABPROT, INR in the last 72  hours.    Assessment: Colitis: CT revealed thickening of sigmoid and descending colon  -Negative GI pathogen panel -C. Diff pending  Calcification of gallbladder, porcelain gallbladder -Normal LFTs, T bili 0.2/AST 12/ALT 11/ALP 59 -MRI/MRCP 06/24/20: Cholelithiasis, without associated findings to suggest acute cholecystitis. Mild central intrahepatic ductal prominence. Common duct is within normal limits. No choledocholithiasis is seen.  Possible mild fundal gallbladder adenomyomatosis, equivocal. No findings to suggest gallbladder adenocarcinoma on MR.  Possible appendiceal mucocele, as per surgery outpatient follow-up recommended but they recommended colonoscopy prior to consideration for surgery for appendiceal mucocele  Plan: Continue supportive care.  Continue empiric antibiotics.  If diarrhea improves/resolves, plan for outpatient colonoscopy (prior to surgical consult for possible appendectomy).  [facility] will follow.   Salley Slaughter PA-C 06/25/2020, 10:34 AM  Contact #  [phone]

## 2020-06-25 NOTE — Progress Notes (Signed)
PROGRESS NOTE    Lisa Morton  KDT:[id number] DOB: [birth year] DOA: 06/22/2020 PCP: Maurice Small, MD   Chief Complaint  Patient presents with  . Weakness    Weakness and diarrhea x1 day.    Brief Narrative:  80 year old female with past medical history of hypertension, depression, anxiety disorder, hyperlipidemia, diastolic congestive heart failure (Echo 12/2017 EF 65-70% G1DD) who presents to [facility] emergency department with complaints of diarrhea and generalized weakness.  CT findings were concerning for colitis. Also, possible appendix mucocele (discussed with surgery in the ED by the EDP - see below).  She's been admitted for IV abx and additional workup.   Assessment & Plan:   Principal Problem:   Acute colitis Active Problems:   Essential hypertension   Anxiety and depression   Sepsis ([place])   Chronic diastolic CHF (congestive heart failure) (HCC)   Colitis  Abdominal Pain  Diarrhea  Sepsis 2/2 Colitis  CT notable for colonic wall thickening and mild colonic hyperemia involving the mid sigmoid colon, concerning for colitis Follow c diff - rejected by lab, GI path panel negative Ceftriaxone/flagyl Leukocytosis, tachycardia in setting of colitis meet criteria for sepsis Follow pending blood cultures NGTDx2 GI c/s, appreciate recs - see Dr. 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 Chrissie Noa from surgery, findings not thought 2/2 disease of appendix Discussed with Dr. Donne Hazel, recommended colonoscopy and surgery follow up outpatient   Mild biliary dilatation with questionable intraluminal density in mid common bile duct Follow RUQ [place] -> funadal gallbladder wall thickening with calcifications and possible multiple small noncalcified stones in the gallbladder neck - hypoechoic mass like tissue in the  gallbladder concerning for gallbladder mass like gallbladder carcinoma Needs to follow porcelain gallbladder outpatient with surgery Will get MRCP to evaluate further -MRCP with cholelithiasis without associated findings to suggest acute cholecystitis.  Mild central intrahepatic ductal prominence.  Common duct within normal limits.  No choledocholithiasis.  Possible mild fundal gallbladder adenomyomatosis, no findings to suggest gallbladder adenocarcinoma on MR.    Hypertension Lisinopril  Anxiety  Depression Clonazepam and zoloft and wellbutrin   HFpEF Appears euvolemic  DVT prophylaxis: (lovenox Code Status: DNR Family Communication: none at bedside Disposition:   Status is: inpatient   The patient will require care spanning > 2 midnights and should be moved to inpatient because: Inpatient level of care appropriate due to severity of illness  Dispo: The patient is from: Home              Anticipated d/c is to: Home              Patient currently is not medically stable to d/c.   Difficult to place patient No  Consultants:   none  Procedures: none  Antimicrobials:  Anti-infectives (From admission, onward)   Start     Dose/Rate Route Frequency Ordered Stop   06/25/20 0400  metroNIDAZOLE (FLAGYL) IVPB 500 mg        500 mg 100 mL/hr over 60 Minutes Intravenous Every 8 hours 06/24/20 2014     06/23/20 1200  cefTRIAXone (ROCEPHIN) 2 g in sodium chloride 0.9 % 100 mL IVPB        2 g 200 mL/hr over 30 Minutes Intravenous Every 24 hours 06/23/20 1157     06/23/20 1000  ciprofloxacin (CIPRO) IVPB 400 mg  Status:  Discontinued  400 mg 200 mL/hr over 60 Minutes Intravenous Every 12 hours 06/23/20 0158 06/23/20 0340   06/23/20 1000  ciprofloxacin (CIPRO) IVPB 400 mg  Status:  Discontinued        400 mg 200 mL/hr over 60 Minutes Intravenous Every 12 hours 06/23/20 0342 06/23/20 1157   06/23/20 0800  metroNIDAZOLE (FLAGYL) IVPB 500 mg  Status:  Discontinued        500  mg 100 mL/hr over 60 Minutes Intravenous Every 8 hours 06/23/20 0158 06/23/20 0340   06/23/20 0800  metroNIDAZOLE (FLAGYL) IVPB 500 mg  Status:  Discontinued        500 mg 100 mL/hr over 60 Minutes Intravenous Every 8 hours 06/23/20 0342 06/24/20 2014   06/22/20 2200  ciprofloxacin (CIPRO) IVPB 400 mg        400 mg 200 mL/hr over 60 Minutes Intravenous  Once 06/22/20 2149 06/22/20 2330   06/22/20 2200  metroNIDAZOLE (FLAGYL) IVPB 500 mg        500 mg 100 mL/hr over 60 Minutes Intravenous  Once 06/22/20 2149 06/23/20 0033   06/22/20 0000  metroNIDAZOLE (FLAGYL) 500 MG tablet  Status:  Discontinued        500 mg Oral 2 times daily 06/22/20 2154 06/22/20    06/22/20 0000  ciprofloxacin (CIPRO) 500 MG tablet        500 mg Oral 2 times daily 06/22/20 2154     06/22/20 0000  metroNIDAZOLE (FLAGYL) 500 MG tablet  Status:  Discontinued        500 mg Oral 2 times daily 06/22/20 2156 06/22/20    06/22/20 0000  metroNIDAZOLE (FLAGYL) 500 MG tablet        500 mg Oral 2 times daily 06/22/20 2157 06/29/20 2359         Subjective: No new complaints  Objective: Vitals:   06/24/20 1330 06/24/20 2017 06/25/20 0426 06/25/20 1500  BP: 115/64 128/60 139/67 119/76  Pulse: 86 88 73 67  Resp: 20 16 20 18   Temp: 97.6 F (36.4 C) 98.2 F (36.8 C) (!) 97.5 F (36.4 C) 98.1 F (36.7 C)  TempSrc:   Oral Oral  SpO2: 99% 100% 96% 97%  Weight:      Height:        Intake/Output Summary (Last 24 hours) at 06/25/2020 1800 Last data filed at 06/25/2020 1500 Gross per 24 hour  Intake 1060 ml  Output --  Net 1060 ml   Filed Weights   06/22/20 1816  Weight: 61.7 kg    Examination:  General: No acute distress. Cardiovascular: Heart sounds show Lisa Morton regular rate, and rhythm Lungs: Clear to auscultation bilaterally  Abdomen: Soft, nontender, nondistended Neurological: Alert and oriented 3. Moves all extremities 4 . Cranial nerves II through XII grossly intact. Skin: Warm and dry. No rashes or  lesions. Extremities: No clubbing or cyanosis. No edema.   Data Reviewed: I have personally reviewed following labs and imaging studies  CBC: Recent Labs  Lab 06/22/20 1753 06/23/20 0229 06/24/20 0601 06/25/20 0711  WBC 17.6* 17.7* 9.0 7.4  NEUTROABS 15.0* 14.4* 6.5 4.8  HGB 11.3* 10.3* 9.5* 10.2*  HCT 35.8* 33.5* 30.4* 33.0*  MCV 96.0 98.5 97.7 97.1  PLT 330 293 238 211    Basic Metabolic Panel: Recent Labs  Lab 06/22/20 1753 06/23/20 0229 06/24/20 0601 06/25/20 0711  NA 133* 135 136 139  K 4.0 3.5 3.2* 3.6  CL 100 106 108 110  CO2 24 21* 20* 21*  GLUCOSE  132* 142* 128* 110*  BUN 14 10 8 8   CREATININE 0.58 0.56 0.57 0.44  CALCIUM 8.6* 8.5* 8.2* 8.4*  MG  --  1.7 1.9 1.8  PHOS  --   --  2.8 3.3    GFR: Estimated Creatinine Clearance: 45.7 mL/min (by C-G formula based on SCr of 0.44 mg/dL).  Liver Function Tests: Recent Labs  Lab 06/22/20 1753 06/23/20 0229 06/24/20 0601 06/25/20 0711  AST 30 17 14* 12*  ALT 17 13 11 11   ALKPHOS 82 74 61 59  BILITOT 1.0 0.7 0.4 0.2*  PROT 6.6 5.8* 5.1* 5.4*  ALBUMIN 3.3* 2.9* 2.5* 2.7*    CBG: No results for input(s): GLUCAP in the last 168 hours.   Recent Results (from the past 240 hour(s))  Resp Panel by RT-PCR (Flu Priyana Mccarey&B, Covid) Nasopharyngeal Swab     Status: None   Collection Time: 06/22/20  9:40 PM   Specimen: Nasopharyngeal Swab; Nasopharyngeal(NP) swabs in vial transport medium  Result Value Ref Range Status   SARS Coronavirus 2 by RT PCR NEGATIVE NEGATIVE Final    Comment: (NOTE) SARS-CoV-2 target nucleic acids are NOT DETECTED.  The SARS-CoV-2 RNA is generally detectable in upper respiratory specimens during the acute phase of infection. The lowest concentration of SARS-CoV-2 viral copies this assay can detect is 138 copies/mL. Falicity Sheets negative result does not preclude SARS-Cov-2 infection and should not be used as the sole basis for treatment or other patient management decisions. Sumner Kirchman negative result may  occur with  improper specimen collection/handling, submission of specimen other than nasopharyngeal swab, presence of viral mutation(s) within the areas targeted by this assay, and inadequate number of viral copies(<138 copies/mL). Demetress Tift negative result must be combined with clinical observations, patient history, and epidemiological information. The expected result is Negative.  Fact Sheet for Patients:  EntrepreneurPulse.com.au  Fact Sheet for Healthcare Providers:  IncredibleEmployment.be  This test is no t yet approved or cleared by the [place] FDA and  has been authorized for detection and/or diagnosis of SARS-CoV-2 by FDA under an Emergency Use Authorization (EUA). This EUA will remain  in effect (meaning this test can be used) for the duration of the COVID-19 declaration under Section 564(b)(1) of the Act, 21 U.S.C.section 360bbb-3(b)(1), unless the authorization is terminated  or revoked sooner.       Influenza Rusty Glodowski by PCR NEGATIVE NEGATIVE Final   Influenza B by PCR NEGATIVE NEGATIVE Final    Comment: (NOTE) The Xpert Xpress SARS-CoV-2/FLU/RSV plus assay is intended as an aid in the diagnosis of influenza from Nasopharyngeal swab specimens and should not be used as Jakhiya Brower sole basis for treatment. Nasal washings and aspirates are unacceptable for Xpert Xpress SARS-CoV-2/FLU/RSV testing.  Fact Sheet for Patients: EntrepreneurPulse.com.au  Fact Sheet for Healthcare Providers: IncredibleEmployment.be  This test is not yet approved or cleared by the [place] FDA and has been authorized for detection and/or diagnosis of SARS-CoV-2 by FDA under an Emergency Use Authorization (EUA). This EUA will remain in effect (meaning this test can be used) for the duration of the COVID-19 declaration under Section 564(b)(1) of the Act, 21 U.S.C. section 360bbb-3(b)(1), unless the authorization is terminated  or revoked.  Performed at [facility], [place] [address], [place], [place] [address]   Culture, blood (routine x 2)     Status: None (Preliminary result)   Collection Time: 06/23/20  2:29 AM   Specimen: BLOOD  Result Value Ref Range Status   Specimen Description   Final  BLOOD RIGHT ANTECUBITAL Performed at [facility] [address], [place], [place] [address]    Special Requests   Final    BOTTLES DRAWN AEROBIC ONLY Blood Culture adequate volume Performed at [facility] [address], [place], [place] [address]    Culture   Final    NO GROWTH 2 DAYS Performed at [facility] [address], [place], [place] [address]    Report Status PENDING  Incomplete  Culture, blood (routine x 2)     Status: None (Preliminary result)   Collection Time: 06/23/20  2:29 AM   Specimen: BLOOD  Result Value Ref Range Status   Specimen Description   Final    BLOOD BLOOD RIGHT FOREARM Performed at [facility] [address], [place], [place] [address]    Special Requests   Final    BOTTLES DRAWN AEROBIC ONLY Blood Culture adequate volume Performed at [facility] [address], [place], [place] [address]    Culture   Final    NO GROWTH 2 DAYS Performed at [facility] [address] [place], [place] [address]    Report Status PENDING  Incomplete  Gastrointestinal Panel by PCR , Stool     Status: None   Collection Time: 06/24/20 12:20 PM   Specimen: STOOL  Result Value Ref Range Status   Campylobacter species NOT DETECTED NOT DETECTED Final   Plesimonas shigelloides NOT DETECTED NOT DETECTED Final   Salmonella species NOT DETECTED NOT DETECTED Final   Yersinia enterocolitica NOT DETECTED NOT DETECTED Final   Vibrio species NOT DETECTED NOT DETECTED Final   Vibrio cholerae NOT DETECTED NOT DETECTED Final   Enteroaggregative E coli (EAEC) NOT DETECTED NOT DETECTED Final    Enteropathogenic E coli (EPEC) NOT DETECTED NOT DETECTED Final   Enterotoxigenic E coli (ETEC) NOT DETECTED NOT DETECTED Final   Shiga like toxin producing E coli (STEC) NOT DETECTED NOT DETECTED Final   Shigella/Enteroinvasive E coli (EIEC) NOT DETECTED NOT DETECTED Final   Cryptosporidium NOT DETECTED NOT DETECTED Final   Cyclospora cayetanensis NOT DETECTED NOT DETECTED Final   Entamoeba histolytica NOT DETECTED NOT DETECTED Final   Giardia lamblia NOT DETECTED NOT DETECTED Final   Adenovirus F40/41 NOT DETECTED NOT DETECTED Final   Astrovirus NOT DETECTED NOT DETECTED Final   Norovirus GI/GII NOT DETECTED NOT DETECTED Final   Rotavirus Kayce Chismar NOT DETECTED NOT DETECTED Final   Sapovirus (I, II, IV, and V) NOT DETECTED NOT DETECTED Final    Comment: Performed at [facility], [address] [place],  [address]         Radiology Studies: MR 3D Recon At Scanner  Result Date: 06/25/2020 CLINICAL DATA:  Abdominal pain, abnormal gallbladder on ultrasound, biliary dilatation EXAM: MRI ABDOMEN WITHOUT AND WITH CONTRAST (INCLUDING MRCP) TECHNIQUE: Multiplanar multisequence MR imaging of the abdomen was performed both before and after the administration of intravenous contrast. Heavily T2-weighted images of the biliary and pancreatic ducts were obtained, and three-dimensional MRCP images were rendered by post processing. CONTRAST:  35mL GADAVIST GADOBUTROL 1 MMOL/ML IV SOLN COMPARISON:  CT abdomen/pelvis dated 06/22/2020. Right upper quadrant ultrasound dated 06/23/2020. FINDINGS: Lower chest: Trace bilateral pleural effusions. Hepatobiliary: 1.5 cm T2 hyperintense lesion with peripheral nodular discontinuous enhancement and delayed wash-in in segment 7 (series 3/image 7), compatible with Tareq Dwan benign hemangioma. No suspicious hepatic lesions. No hepatic steatosis. Mild layering gallstones, without associated inflammatory changes to suggest acute cholecystitis.  Fundal gallbladder wall  thickening is relatively mild on MR (series 15/image 25; series 6/image 11) but may reflect benign gallbladder adenomyomatosis. Possible soft tissue in the gallbladder fundus (series 15/image 28) is nonenhancing, suggesting sludge. Known gallbladder wall calcifications are not evident on MR. No findings to suggest gallbladder adenocarcinoma. Mild central intrahepatic ductal prominence. Common duct measures 5-6 mm, within normal limits. No choledocholithiasis is seen. Pancreas:  Within normal limits. Spleen:  Within normal limits. Adrenals/Urinary Tract:  Adrenal glands are within normal limits. Subcentimeter left renal cysts. Right kidney is within normal limits. No hydronephrosis. Stomach/Bowel: Stomach is within normal limits. Visualized bowel is unremarkable. Vascular/Lymphatic:  No evidence of abdominal aortic aneurysm. No suspicious abdominal lymphadenopathy. Other:  No abdominal ascites. Musculoskeletal: No focal osseous lesions. IMPRESSION: Cholelithiasis, without associated findings to suggest acute cholecystitis. Mild central intrahepatic ductal prominence. Common duct is within normal limits. No choledocholithiasis is seen. Possible mild fundal gallbladder adenomyomatosis, equivocal. No findings to suggest gallbladder adenocarcinoma on MR. 1.5 cm benign hemangioma in segment 7. Electronically Signed   By: Julian Hy M.D.   On: 06/25/2020 07:47   MR ABDOMEN MRCP W WO CONTAST  Result Date: 06/25/2020 CLINICAL DATA:  Abdominal pain, abnormal gallbladder on ultrasound, biliary dilatation EXAM: MRI ABDOMEN WITHOUT AND WITH CONTRAST (INCLUDING MRCP) TECHNIQUE: Multiplanar multisequence MR imaging of the abdomen was performed both before and after the administration of intravenous contrast. Heavily T2-weighted images of the biliary and pancreatic ducts were obtained, and three-dimensional MRCP images were rendered by post processing. CONTRAST:  46mL GADAVIST GADOBUTROL 1 MMOL/ML IV SOLN COMPARISON:  CT  abdomen/pelvis dated 06/22/2020. Right upper quadrant ultrasound dated 06/23/2020. FINDINGS: Lower chest: Trace bilateral pleural effusions. Hepatobiliary: 1.5 cm T2 hyperintense lesion with peripheral nodular discontinuous enhancement and delayed wash-in in segment 7 (series 3/image 7), compatible with Lillyth Spong benign hemangioma. No suspicious hepatic lesions. No hepatic steatosis. Mild layering gallstones, without associated inflammatory changes to suggest acute cholecystitis. Fundal gallbladder wall thickening is relatively mild on MR (series 15/image 25; series 6/image 11) but may reflect benign gallbladder adenomyomatosis. Possible soft tissue in the gallbladder fundus (series 15/image 28) is nonenhancing, suggesting sludge. Known gallbladder wall calcifications are not evident on MR. No findings to suggest gallbladder adenocarcinoma. Mild central intrahepatic ductal prominence. Common duct measures 5-6 mm, within normal limits. No choledocholithiasis is seen. Pancreas:  Within normal limits. Spleen:  Within normal limits. Adrenals/Urinary Tract:  Adrenal glands are within normal limits. Subcentimeter left renal cysts. Right kidney is within normal limits. No hydronephrosis. Stomach/Bowel: Stomach is within normal limits. Visualized bowel is unremarkable. Vascular/Lymphatic:  No evidence of abdominal aortic aneurysm. No suspicious abdominal lymphadenopathy. Other:  No abdominal ascites. Musculoskeletal: No focal osseous lesions. IMPRESSION: Cholelithiasis, without associated findings to suggest acute cholecystitis. Mild central intrahepatic ductal prominence. Common duct is within normal limits. No choledocholithiasis is seen. Possible mild fundal gallbladder adenomyomatosis, equivocal. No findings to suggest gallbladder adenocarcinoma on MR. 1.5 cm benign hemangioma in segment 7. Electronically Signed   By: Julian Hy M.D.   On: 06/25/2020 07:47   US Abdomen Limited RUQ (LIVER/GB)  Result Date:  06/23/2020 CLINICAL DATA:  Mild biliary ductal dilatation with Zuri Lascala questionable intraluminal density in the common duct on an abdomen and pelvis CT obtained yesterday. There were also fundal gallbladder wall calcifications and Zaynah Chawla subcentimeter enhancing focus in the right hepatic lobe. Also demonstrated was mild gallbladder wall thickening. EXAM: ULTRASOUND ABDOMEN LIMITED RIGHT UPPER QUADRANT COMPARISON:  Abdomen and pelvis CT obtained yesterday. FINDINGS: Gallbladder: Fundal  wall thickening and calcifications. Anterior hypoechoic mass-like area and extending the length of the gallbladder with Tobiah Celestine maximum thickness of 1.3 cm. Possible multiple small noncalcified stones in the gallbladder neck. Common bile duct: Diameter: 5.5 mm. Not well visualized distally. No intraluminal calculus or mass visualized. Liver: Normal echotexture. No sonographically visible mass. Mild intrahepatic ductal dilatation. Portal vein is patent on color Doppler imaging with normal direction of blood flow towards the liver. Other: None. IMPRESSION: 1. Fundal gallbladder wall thickening with calcifications and possible multiple small noncalcified stones in the gallbladder neck. There is also extensive anterior mildly hypoechoic mass-like tissue in the gallbladder, concerning for Janera Peugh possible gallbladder mass such as gallbladder carcinoma. Inspissated sludge is also Yuvraj Pfeifer possibility. 2. Mild intrahepatic biliary ductal dilatation with no sonographically visible obstructing mass or calculus. 3. No sonographic abnormality visualized to account for the subcentimeter enhancing focus seen in the right hepatic lobe on the CT. Electronically Signed   By: Claudie Revering M.D.   On: 06/23/2020 20:24        Scheduled Meds: . buPROPion  150 mg Oral Daily  . clonazePAM  1 mg Oral QHS  . enoxaparin (LOVENOX) injection  40 mg Subcutaneous Q24H  . lisinopril  5 mg Oral Daily  . sertraline  100 mg Oral Daily   Continuous Infusions: . cefTRIAXone (ROCEPHIN)   IV 200 mL/hr at 06/25/20 0600  . metronidazole 500 mg (06/25/20 1352)     LOS: 2 days    Time spent: over 30 min    Fayrene Helper, MD [facility]   To contact the attending provider between 7A-7P or the covering provider during after hours 7P-7A, please log into the web site [email or web address] and access using universal [place] password for that web site. If you do not have the password, please call the hospital operator.  06/25/2020, 6:00 PM

## 2020-06-26 LAB — CBC WITH DIFFERENTIAL/PLATELET
Abs Immature Granulocytes: 0.02 10*3/uL (ref 0.00–0.07)
Basophils Absolute: 0.1 10*3/uL (ref 0.0–0.1)
Basophils Relative: 2 %
Eosinophils Absolute: 0.4 10*3/uL (ref 0.0–0.5)
Eosinophils Relative: 6 %
HCT: 33.4 % — ABNORMAL LOW (ref 36.0–46.0)
Hemoglobin: 10.4 g/dL — ABNORMAL LOW (ref 12.0–15.0)
Immature Granulocytes: 0 %
Lymphocytes Relative: 23 %
Lymphs Abs: 1.6 10*3/uL (ref 0.7–4.0)
MCH: 30.1 pg (ref 26.0–34.0)
MCHC: 31.1 g/dL (ref 30.0–36.0)
MCV: 96.8 fL (ref 80.0–100.0)
Monocytes Absolute: 0.6 10*3/uL (ref 0.1–1.0)
Monocytes Relative: 9 %
Neutro Abs: 4.1 10*3/uL (ref 1.7–7.7)
Neutrophils Relative %: 60 %
Platelets: 291 10*3/uL (ref 150–400)
RBC: 3.45 MIL/uL — ABNORMAL LOW (ref 3.87–5.11)
RDW: 13.4 % (ref 11.5–15.5)
WBC: 6.8 10*3/uL (ref 4.0–10.5)
nRBC: 0 % (ref 0.0–0.2)

## 2020-06-26 LAB — COMPREHENSIVE METABOLIC PANEL
ALT: 11 U/L (ref 0–44)
AST: 15 U/L (ref 15–41)
Albumin: 2.7 g/dL — ABNORMAL LOW (ref 3.5–5.0)
Alkaline Phosphatase: 60 U/L (ref 38–126)
Anion gap: 8 (ref 5–15)
BUN: 6 mg/dL — ABNORMAL LOW (ref 8–23)
CO2: 22 mmol/L (ref 22–32)
Calcium: 8.6 mg/dL — ABNORMAL LOW (ref 8.9–10.3)
Chloride: 107 mmol/L (ref 98–111)
Creatinine, Ser: 0.59 mg/dL (ref 0.44–1.00)
GFR, Estimated: >60 mL/min (ref >60)
Glucose, Bld: 102 mg/dL — ABNORMAL HIGH (ref 70–99)
Potassium: 3.7 mmol/L (ref 3.5–5.1)
Sodium: 137 mmol/L (ref 135–145)
Total Bilirubin: 0.1 mg/dL — ABNORMAL LOW (ref 0.3–1.2)
Total Protein: 5.2 g/dL — ABNORMAL LOW (ref 6.5–8.1)

## 2020-06-26 LAB — PHOSPHORUS: Phosphorus: 3.2 mg/dL (ref 2.5–4.6)

## 2020-06-26 LAB — MAGNESIUM: Magnesium: 2 mg/dL (ref 1.7–2.4)

## 2020-06-26 MED ORDER — PROBIOTIC 250 MG PO CAPS: 1.0000 | ORAL_CAPSULE | Freq: Two times a day (BID) | ORAL | 0 refills | Status: AC

## 2020-06-26 MED ORDER — PROBIOTIC 250 MG PO CAPS: 1.0000 | ORAL_CAPSULE | Freq: Every day | ORAL | 0 refills | Status: DC

## 2020-06-26 MED ORDER — METRONIDAZOLE 500 MG PO TABS: 500.0000 mg | ORAL_TABLET | Freq: Two times a day (BID) | ORAL | 0 refills | Status: AC

## 2020-06-26 MED ORDER — CEFDINIR 300 MG PO CAPS: 300.0000 mg | ORAL_CAPSULE | Freq: Two times a day (BID) | ORAL | 0 refills | Status: AC

## 2020-06-26 NOTE — Progress Notes (Signed)
Occupational Therapy Treatment Patient Details Name: Lisa Morton MRN: [id number] DOB: Mar 11, 1941 Today's Date: 06/26/2020    History of present illness 80 year old female with past medical history of hypertension, depression, anxiety disorder, hyperlipidemia, diastolic congestive heart failure (Echo 12/2017 EF 65-70% G1DD) who presents to [facility] emergency department with complaints of diarrhea and generalized weakness. CT findings were concerning for colitis. Also had L reverse TSA on 3/21 after humeral fracture.   OT comments  Patient progressing and showed improved ability to demonstrate elbow-->hand exercises per post-op protocol, and began pendulums with slow progress due to [facility] joint stiffness from sling wear.  Anticipate pt will need to work on pendulums ad lib to loosen LT shoulder. Patient is highly motivated and cooperative, and remains limited by post-op rTSA restrictions and living along with all available family members have to work. Pt continues to demonstrate good rehab potential and would benefit from continued skilled OT to increase safety and independence with ADLs and functional transfers to allow pt to return home safely and reduce caregiver burden and fall risk.   Follow Up Recommendations  Home health OT;Supervision/Assistance - 24 hour;Follow surgeon's recommendation for DC plan and follow-up therapies;Other (comment)    Equipment Recommendations  None recommended by OT    Recommendations for Other Services      Precautions / Restrictions Precautions Precautions: Fall;Shoulder Type of Shoulder Precautions: AROM elbow, wrist and hand ok, per operative note okay for gentle pendulums, no A/PROM at shoulder (prior prior chart) Shoulder Interventions: Shoulder sling/immobilizer;Off for dressing/bathing/exercises Required Braces or Orthoses: Sling Restrictions Weight Bearing Restrictions: Yes Other Position/Activity Restrictions: NO AROM/PROM of shoulder;  sling at all times       Mobility Bed Mobility Overal bed mobility: Needs Assistance Bed Mobility: Sidelying to Sit   Sidelying to sit: Min assist            Transfers Overall transfer level: Needs assistance Equipment used: None Transfers: Sit to/from [facility] Sit to Stand: Min guard Stand pivot transfers: Min guard       General transfer comment: steady assist only for safety    Balance Overall balance assessment: Needs assistance Sitting-balance support: No upper extremity supported;Feet supported Sitting balance-Leahy Scale: Good     Standing balance support: Single extremity supported;No upper extremity supported Standing balance-Leahy Scale: Fair Standing balance comment: pt was able to maintain static and dynamic standing balance with RT UE support on back of chair to begin pendulums                           ADL either performed or assessed with clinical judgement   ADL       Grooming: Sitting;Set up;Oral care Grooming Details (indicate cue type and reason): Pt educated on 1-handed techinques for managing toothbrush and toothpaste. Pt then able to manage oral care after setup.         Upper Body Dressing : Moderate assistance;Sitting;Cueing for UE precautions;Cueing for sequencing Upper Body Dressing Details (indicate cue type and reason): Mod As to manage gown and sling.     Toilet Transfer: Psychologist, sport and exercise Details (indicate cue type and reason): 1-hand held assistance to pivot to recliner.         Functional mobility during ADLs: Min guard       Vision Patient Visual Report: No change from baseline     Perception     Praxis      Cognition Arousal/Alertness: Awake/alert Behavior During  Therapy: WFL for tasks assessed/performed Overall Cognitive Status: Within Functional Limits for tasks assessed                                 General Comments: AxO x 3 very  pleasant        Exercises Shoulder Exercises Pendulum Exercise: Self ROM;Left;Standing (Still very Stiff at [facility] joint.  No observable movement at [facility] joint. Cues to avoid any AROM.) Elbow Flexion: AROM;15 reps;Left Elbow Extension: AROM;15 reps;Left (Lacking full elbow extension.) Wrist Flexion: Left;10 reps;AROM Wrist Extension: AROM;Left;10 reps Digit Composite Flexion: AROM;Left;5 reps Composite Extension: AROM;Left;5 reps Hand Exercises Forearm Supination: AROM;Left;10 reps;Seated Forearm Pronation: AROM;Left;10 reps;Seated Other Exercises Other Exercises: Gentle LT scapular mobs with pt in RT sidelying position.  Palpating fair scapulothoracic gliding in all planes.   Shoulder Instructions Shoulder Instructions Donning/doffing sling/immobilizer: Moderate assistance Correct positioning of sling/immobilizer: Patient able to independently direct caregiver Pendulum exercises (written home exercise program): Minimal assistance;Patient able to independently direct caregiver ROM for elbow, wrist and digits of operated UE: Supervision/safety Proper positioning of operated UE when showering: Supervision/safety (Pt re-educated on need for LT elbow to remains anterior to LT hip at all times and ues of pillows in bed and chair to support.)     General Comments      Pertinent Vitals/ Pain       Pain Assessment: Faces Faces Pain Scale: Hurts a little bit Pain Location: L shoulder Pain Descriptors / Indicators: Guarding Pain Intervention(s): Limited activity within patient's tolerance;Monitored during session (Adjusted sling as needed)  Home Living                                          Prior Functioning/Environment              Frequency  Min 2X/week        Progress Toward Goals  OT Goals(current goals can now be found in the care plan section)  Progress towards OT goals: Progressing toward goals  Acute Rehab OT Goals Patient Stated Goal: regain  independence OT Goal Formulation: With patient Time For Goal Achievement: 07/08/20 Potential to Achieve Goals: Good  Plan Discharge plan remains appropriate    Co-evaluation                 AM-PAC OT "6 Clicks" Daily Activity     Outcome Measure   Help from another person eating meals?: A Little (Full setup) Help from another person taking care of personal grooming?: A Little Help from another person toileting, which includes using toliet, bedpan, or urinal?: A Little Help from another person bathing (including washing, rinsing, drying)?: A Little Help from another person to put on and taking off regular upper body clothing?: A Lot Help from another person to put on and taking off regular lower body clothing?: A Little 6 Click Score: 17    End of Session Equipment Utilized During Treatment:  (sling)  OT Visit Diagnosis: Unsteadiness on feet (R26.81);Pain;History of falling (Z91.81);Muscle weakness (generalized) (M62.81) Pain - Right/Left: Left Pain - part of body: Shoulder   Activity Tolerance Patient tolerated treatment well   Patient Left in chair;with call bell/phone within reach;with chair alarm set   Nurse Communication Mobility status        Time: 1517-6160 OT Time Calculation (min): 49 min  Charges: OT General  Charges $OT Visit: 1 Visit OT Treatments $Self Care/Home Management : 8-22 mins $Therapeutic Activity: 8-22 mins $Therapeutic Exercise: 8-22 mins  Lisa Morton, OT Acute Rehab Services Office: [phone] 06/26/2020   Lisa Morton 06/26/2020, 10:32 AM

## 2020-06-26 NOTE — Care Management Important Message (Signed)
Important Message  Patient Details IM Letter given to the Patient. Name: Lisa Morton MRN: [id number] Date of Birth: 1940-04-24   Medicare Important Message Given:  Yes     Kerin Salen 06/26/2020, 9:19 AM

## 2020-06-26 NOTE — Progress Notes (Signed)
[facility] Gastroenterology Progress Note  Lisa Morton 80 y.o. October 02, 1940  CC:  Diarrhea   Subjective: Patient reports alternating formed and loose stools. Denies rectal bleeding or melena.  C diff was unable to be resulted as stool has been formed.  Denies abdominal pain.  She is tolerating a diet and denies nausea/vomiting.  ROS : Review of Systems  Cardiovascular: Negative for chest pain and palpitations.  Gastrointestinal: Positive for diarrhea. Negative for abdominal pain, blood in stool, constipation, heartburn, melena, nausea and vomiting.    Objective: Vital signs in last 24 hours: Vitals:   06/25/20 1958 06/26/20 0308  BP: (!) 120/59 133/66  Pulse: 78 74  Resp: 15 15  Temp: 97.6 F (36.4 C) 98.2 F (36.8 C)  SpO2: 100% 97%    Physical Exam:  General:  Alert, cooperative, no distress, appears stated age  Head:  Normocephalic, without obvious abnormality, atraumatic  Eyes:  Anicteric sclera, EOMs intact  Lungs:   Respirations unlabored  Abdomen:   Soft, non-tender, non-distended, bowel sounds active all four quadrants,  no guarding or peritoneal signs  Extremities: Left arm in sling; no bilateral lower extremity edema    Lab Results: Recent Labs    06/25/20 0711 06/26/20 0453  NA 139 137  K 3.6 3.7  CL 110 107  CO2 21* 22  GLUCOSE 110* 102*  BUN 8 6*  CREATININE 0.44 0.59  CALCIUM 8.4* 8.6*  MG 1.8 2.0  PHOS 3.3 3.2   Recent Labs    06/25/20 0711 06/26/20 0453  AST 12* 15  ALT 11 11  ALKPHOS 59 60  BILITOT 0.2* 0.1*  PROT 5.4* 5.2*  ALBUMIN 2.7* 2.7*   Recent Labs    06/25/20 0711 06/26/20 0453  WBC 7.4 6.8  NEUTROABS 4.8 4.1  HGB 10.2* 10.4*  HCT 33.0* 33.4*  MCV 97.1 96.8  PLT 266 291   No results for input(s): LABPROT, INR in the last 72 hours.    Assessment: Colitis: Improving. CT revealed thickening of sigmoid and descending colon. On empiric abx. -Negative GI pathogen panel -C. Diff unable to be completed due to formed  stool  Calcification of gallbladder, porcelain gallbladder -Normal LFTs, T bili 0.2/AST 12/ALT 11/ALP 59 -MRI/MRCP 06/24/20: Cholelithiasis, without associated findings to suggest acute cholecystitis. Mild central intrahepatic ductal prominence. Common duct is within normal limits. No choledocholithiasis is seen.  Possible mild fundal gallbladder adenomyomatosis, equivocal. No findings to suggest gallbladder adenocarcinoma on MR.  Possible appendiceal mucocele, as per surgery outpatient follow-up recommended but they recommended colonoscopy prior to consideration for surgery for appendiceal mucocele  Plan: Transition to PO antibiotics and complete 7-10 day course.  Patient can try Imodium up to 4x per day if persistent watery diarrhea.  Plan for outpatient colonoscopy (prior to surgical consult for possible appendectomy).  [facility] will sign off. Please contact us if we can be of any further assistance during this hospital stay.   Salley Slaughter PA-C 06/26/2020, 10:07 AM  Contact #  [phone]

## 2020-06-26 NOTE — Discharge Summary (Addendum)
Physician Discharge Summary  Lisa Morton WUJ:[id number] DOB: 02/05/1941 DOA: 06/22/2020  PCP: Maurice Small, MD  Admit date: 06/22/2020 Discharge date: 06/26/2020  Time spent: 40 minutes  Recommendations for Outpatient Follow-up:  1. Follow outpatient CBC/CMP 2. Follow dilated blind ending tubular structure concerning for appendix mucocele ->per discussion with surgery, findings not thought due to disease of appendix, but recommended outpatient colonoscopy and then surgery follow up   3. 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 Follow colitis outpatient with GI   Discharge Diagnoses:  Principal Problem:   Acute colitis Active Problems:   Essential hypertension   Anxiety and depression   Sepsis ([place])   Chronic diastolic CHF (congestive heart failure) (HCC)   Colitis   Discharge Condition: stable  Diet recommendation: heart healthy  Filed Weights   06/22/20 1816  Weight: 61.7 kg    History of present illness:  80 year old female with past medical history of hypertension, depression, anxiety disorder, hyperlipidemia, diastolic congestive heart failure(Echo 12/2017 EF 65-70% G1DD)who presents to [facility] emergency department with complaints of diarrhea and generalized weakness.  CT findings were concerning for colitis. Also, possible appendix mucocele (discussed with surgery in the ED by the EDP - see below).  She's been admitted for IV abx and additional workup.   She's improved with IV antibiotics.  She had RUQ [place] which was concerning for gallbladder mass, but MRCP did not show any findings concerning for gallbladder adenocarcinoma, showed mild fundal gallbladder adenomyomatosis.  GI pathogen panel was negative, stool sample rejected for c diff x2.  GI  consulted and planning on outpatient follow up.    See below for additional recommendations  Hospital Course:  Abdominal Pain  Diarrhea  Sepsis 2/2 Colitis  CT notable for colonic wall thickening and mild colonic hyperemia involving the mid sigmoid colon, concerning for colitis Follow c diff - rejected by lab x2, GI path panel negative Ceftriaxone/flagyl -> d/c with cefdinir/flagyl to complete 10 day course Leukocytosis, tachycardia in setting of colitis meet criteria for sepsis Follow pending blood cultures NGTDx3 GI c/s, appreciate recs - appreciate GI recommendations - needs outpatient colonoscopy prior to surgical c/s outpatient for below.  PO antibiotics.  Imodium prn.    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 Chrissie Noa from surgery, findings not thought 2/2 disease of appendix Discussed with Dr. Donne Hazel, recommended colonoscopy and surgery follow up outpatient   Mild biliary dilatation with questionable intraluminal density in mid common bile duct Follow RUQ [place] -> funadal gallbladder wall thickening with calcifications and possible multiple small noncalcified stones in the gallbladder neck - hypoechoic mass like tissue in the gallbladder concerning for gallbladder mass like gallbladder carcinoma Needs to follow porcelain gallbladder outpatient with surgery Will get MRCP to evaluate further -MRCP with cholelithiasis without associated findings to suggest acute cholecystitis.  Mild central intrahepatic ductal prominence.  Common duct within normal limits.  No choledocholithiasis.  Possible mild fundal gallbladder adenomyomatosis, no findings to suggest gallbladder adenocarcinoma on MR.   Follow outpatient with GI and surgery  Hypertension Lisinopril  Anxiety  Depression Clonazepam and zoloft and wellbutrin   HFpEF  Procedures:  none  Consultations:  GI  Discharge Exam: Vitals:    06/26/20 0308 06/26/20 1036  BP: 133/66 137/64  Pulse: 74 86  Resp: 15 18  Temp: 98.2 F (36.8 C)   SpO2: 97% 99%   Feels better Still some loose stools at times Called daughter. No answer  General: No acute distress. Cardiovascular: Heart sounds show Lisa Morton regular rate, and rhythm.  Lungs: Clear to auscultation bilaterally  Abdomen: Soft, nontender, nondistended  Neurological: Alert and oriented 3. Moves all extremities 4 . Cranial nerves II through XII grossly intact. Skin: Warm and dry. No rashes or lesions. Extremities: No clubbing or cyanosis. No edema.  Discharge Instructions   Discharge Instructions    Call MD for:  difficulty breathing, headache or visual disturbances   Complete by: As directed    Call MD for:  hives   Complete by: As directed    Call MD for:  persistant dizziness or light-headedness   Complete by: As directed    Call MD for:  persistant nausea and vomiting   Complete by: As directed    Call MD for:  redness, tenderness, or signs of infection (pain, swelling, redness, odor or green/yellow discharge around incision site)   Complete by: As directed    Call MD for:  severe uncontrolled pain   Complete by: As directed    Call MD for:  temperature >100.4   Complete by: As directed    Diet - low sodium heart healthy   Complete by: As directed    Discharge instructions   Complete by: As directed    You were seen for colitis.  You've improved with antibiotics.  We'll send you home with cefdinir and flagyl for an additional 6 days (you were sent cipro and flagyl from the ED - take the flagyl and cefdinir only, you'll have about 2 extra flagyl pills from the ED prescription).  You had abdominal CT findings concerning for an appendix mucocele.  You need to follow up with gastroenterology for Lisa Morton colonoscopy outpatient.  Please follow up with gastroenterology to follow up your colitis and discuss Lisa Morton colonoscopy.  You'll need to follow up with surgery for your  abnormal imaging findings after Lisa Morton discussion and workup with gastroenterology.  Your MRCP showed gallstones, but no cholecystitis (gallbladder inflammation).  It also showed possible gallbladder adenomyomatosis.  No findings to suggest gallbladder adenocarcinoma were seen on MR.  Please follow up with your PCP and/or gastroenterology.  You have porcelain gallbladder which should be followed outpatient.   Return for new, recurrent, or worsening symptoms.  Please ask your PCP to request records from this hospitalization so they know what was done and what the next steps will be.   Increase activity slowly   Complete by: As directed    No wound care   Complete by: As directed      Allergies as of 06/26/2020      Reactions   Codeine Nausea And Vomiting   Latex Rash   Prolonged exposure to skin   Penicillins Rash      Medication List    TAKE these medications   buPROPion 150 MG 24 hr tablet Commonly known as: WELLBUTRIN XL Take 150 mg by mouth daily.   CAL-MAG-ZINC PO Take 1 tablet by mouth daily.   cefdinir 300 MG capsule Commonly known as: OMNICEF Take 1 capsule (300 mg total) by mouth 2 (two) times daily for 6 days.   CENTRUM SILVER ADULT 50+ PO Take 1 tablet by mouth daily.   clonazePAM 1 MG tablet Commonly known as: KLONOPIN Take 1 mg by mouth at bedtime.   HYDROcodone-acetaminophen 5-325 MG tablet  Commonly known as: NORCO/VICODIN Take 1-2 tablets by mouth every 4 (four) hours as needed for moderate pain (pain score 4-6).   lisinopril 5 MG tablet Commonly known as: ZESTRIL Take 5 mg by mouth daily.   metroNIDAZOLE 500 MG tablet Commonly known as: FLAGYL Take 1 tablet (500 mg total) by mouth 2 (two) times daily for 6 days. For ten days   ondansetron 4 MG disintegrating tablet Commonly known as: Zofran ODT Take 1 tablet (4 mg total) by mouth every 8 (eight) hours as needed for nausea or vomiting.   OVER THE COUNTER MEDICATION Take 1 capsule by mouth 3 (three)  times Ranay Ketter week. Blood Builder- contains Vitamin C, Folate, Vitamin B12, and Iron. Takes on Wed and Sunday.   Probiotic 250 MG Caps Take 1 capsule by mouth 2 (two) times daily.   Red Yeast Rice 600 MG Caps Take 1,200 mg by mouth in the morning and at bedtime.   sertraline 100 MG tablet Commonly known as: ZOLOFT Take 100 mg by mouth daily.   vitamin B-12 1000 MCG tablet Commonly known as: CYANOCOBALAMIN Take 1,000 mcg by mouth 4 (four) times Candice Tobey week.   Vitamin D 50 MCG (2000 UT) Caps Take 2,000 Units by mouth daily.      Allergies  Allergen Reactions  . Codeine Nausea And Vomiting  . Latex Rash    Prolonged exposure to skin  . Penicillins Rash    Follow-up Information    Your physician. Call in 3 days.        [facility] Follow up.   Specialty: Home Health Services Why: agency will provide home health physical therapy. Contact information: [address] [place] [address] [phone]        Ronnette Juniper, MD Follow up.   Specialty: Gastroenterology Why: Call for Erol Flanagin follow up appointment with [facility] gastroenterology Contact information: [place] [address] [phone]                The results of significant diagnostics from this hospitalization (including imaging, microbiology, ancillary and laboratory) are listed below for reference.    Significant Diagnostic Studies: DG Shoulder 1V Left  Result Date: 06/04/2020 CLINICAL DATA:  Status post reverse shoulder arthroplasty EXAM: LEFT SHOULDER COMPARISON:  None. FINDINGS: Shoulder replacement is noted. No acute bony or soft tissue abnormality is seen. IMPRESSION: Status post left shoulder replacement.  No acute abnormality noted. Electronically Signed   By: Inez Catalina M.D.   On: 06/04/2020 11:18   CT ABDOMEN PELVIS W CONTRAST  Result Date: 06/22/2020 CLINICAL DATA:  Diverticulitis suspected RLQ pain Patient reports weakness and diarrhea. Shoulder surgery 2  weeks ago. EXAM: CT ABDOMEN AND PELVIS WITH CONTRAST TECHNIQUE: Multidetector CT imaging of the abdomen and pelvis was performed using the standard protocol following bolus administration of intravenous contrast. CONTRAST:  151mL OMNIPAQUE IOHEXOL 300 MG/ML  SOLN COMPARISON:  None. FINDINGS: Lower chest: Hypoventilatory changes in the lung bases. The heart is normal in size with coronary artery calcifications. Patulous distal esophagus. Hepatobiliary: Subcentimeter enhancing focus in the right hepatic lobe, series 3, image 18, may be flash filling hemangioma or AP shunt. There is left greater than right intrahepatic biliary ductal dilatation. Proximal common bile duct is mildly dilated measuring 9 mm, with questionable intraluminal density, series 3, image 35. Physiologically distended gallbladder. Fundal gallbladder wall calcifications as well as mild wall thickening, series 3, image 39. No pericholecystic fat stranding. Pancreas: No ductal dilatation or inflammation.  Spleen: Normal in size without focal abnormality. Adrenals/Urinary Tract: Normal adrenal glands. No hydronephrosis or perinephric edema. Homogeneous renal enhancement with symmetric excretion on delayed phase imaging. Multiple small low-density lesions scattered throughout the left greater than right renal parenchyma are subcentimeter and too small to characterize. Urinary bladder is distended without wall thickening. Stomach/Bowel: Patulous distal esophagus decompressed stomach. Few fluid-filled small bowel loops in the pelvis without abnormal distension. Mild fecalization of distal ileal bowel loops. There is Lisa Morton blind-ending tubular structure in the right lower quadrant that is dilated at 17 mm and contains internal low-density. No wall hyperemia, wall thickening, or adjacent inflammation. Colonic wall thickening with mild colonic hyperemia involving the mid sigmoid colon, series 3, image 53. May be additional wall thickening involving the descending  colon is nondistended limiting assessment. Moderate stool in the proximal as well as sigmoid colon. No significant diverticular disease. Vascular/Lymphatic: Moderate aortic atherosclerosis. No aortic aneurysm. The portal vein is patent. No enlarged lymph nodes in the abdomen or pelvis. There is no ileocolic adenopathy. Reproductive: Status post hysterectomy. No adnexal masses. Other: Trace free fluid in the pelvis which may be reactive. No upper abdominal ascites. No free air or focal fluid collection. There is Lisa Morton tiny fat containing umbilical hernia. Musculoskeletal: Scoliosis and multilevel degenerative change in the spine. There are no acute or suspicious osseous abnormalities. IMPRESSION: 1. Colonic wall thickening with mild colonic hyperemia involving the mid sigmoid colon, suspicious for colitis. There may be additional wall thickening involving the descending colon, which is nondistended limiting assessment. No significant diverticular disease. 2. Dilated blind-ending tubular structure in the right lower quadrant measuring 17 mm contains internal low-density, without wall thickening, wall thickening, or adjacent inflammation. This is suspicious for an appendix mucocele. Recommend surgical consultation. 3. Mild biliary dilatation with questionable intraluminal density in the mid common bile duct. Recommend correlation with LFTs. MRCP is recommended only if patient is able to tolerate breath hold technique. 4. 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 There is also mild gallbladder wall thickening. Aortic Atherosclerosis (ICD10-I70.0). Electronically Signed   By: Keith Rake M.D.   On: 06/22/2020 19:52   MR 3D Recon At Scanner  Result Date: 06/25/2020 CLINICAL DATA:  Abdominal pain, abnormal gallbladder on ultrasound, biliary dilatation EXAM: MRI ABDOMEN WITHOUT AND WITH CONTRAST (INCLUDING MRCP) TECHNIQUE: Multiplanar multisequence MR imaging of the abdomen was  performed both before and after the administration of intravenous contrast. Heavily T2-weighted images of the biliary and pancreatic ducts were obtained, and three-dimensional MRCP images were rendered by post processing. CONTRAST:  57mL GADAVIST GADOBUTROL 1 MMOL/ML IV SOLN COMPARISON:  CT abdomen/pelvis dated 06/22/2020. Right upper quadrant ultrasound dated 06/23/2020. FINDINGS: Lower chest: Trace bilateral pleural effusions. Hepatobiliary: 1.5 cm T2 hyperintense lesion with peripheral nodular discontinuous enhancement and delayed wash-in in segment 7 (series 3/image 7), compatible with Lisa Morton benign hemangioma. No suspicious hepatic lesions. No hepatic steatosis. Mild layering gallstones, without associated inflammatory changes to suggest acute cholecystitis. Fundal gallbladder wall thickening is relatively mild on MR (series 15/image 25; series 6/image 11) but may reflect benign gallbladder adenomyomatosis. Possible soft tissue in the gallbladder fundus (series 15/image 28) is nonenhancing, suggesting sludge. Known gallbladder wall calcifications are not evident on MR. No findings to suggest gallbladder adenocarcinoma. Mild central intrahepatic ductal prominence. Common duct measures 5-6 mm, within normal limits. No choledocholithiasis is seen. Pancreas:  Within normal limits. Spleen:  Within normal limits. Adrenals/Urinary Tract:  Adrenal glands are within normal limits. Subcentimeter left renal cysts. Right kidney is  within normal limits. No hydronephrosis. Stomach/Bowel: Stomach is within normal limits. Visualized bowel is unremarkable. Vascular/Lymphatic:  No evidence of abdominal aortic aneurysm. No suspicious abdominal lymphadenopathy. Other:  No abdominal ascites. Musculoskeletal: No focal osseous lesions. IMPRESSION: Cholelithiasis, without associated findings to suggest acute cholecystitis. Mild central intrahepatic ductal prominence. Common duct is within normal limits. No choledocholithiasis is seen.  Possible mild fundal gallbladder adenomyomatosis, equivocal. No findings to suggest gallbladder adenocarcinoma on MR. 1.5 cm benign hemangioma in segment 7. Electronically Signed   By: Julian Hy M.D.   On: 06/25/2020 07:47   DG Chest Port 1 View  Result Date: 06/22/2020 CLINICAL DATA:  Weakness and diarrhea. Left shoulder surgery 2 weeks ago. EXAM: PORTABLE CHEST 1 VIEW COMPARISON:  None. FINDINGS: The cardiomediastinal contours are normal. Mild aortic atherosclerosis. The lungs are clear. Pulmonary vasculature is normal. No consolidation, pleural effusion, or pneumothorax. Reverse left shoulder arthroplasty, intact were visualized. No acute osseous abnormalities are seen. IMPRESSION: 1. No acute chest findings. 2. Reverse left shoulder arthroplasty, intact were visualized. 3.  Aortic Atherosclerosis (ICD10-I70.0). Electronically Signed   By: Keith Rake M.D.   On: 06/22/2020 18:14   MR ABDOMEN MRCP W WO CONTAST  Result Date: 06/25/2020 CLINICAL DATA:  Abdominal pain, abnormal gallbladder on ultrasound, biliary dilatation EXAM: MRI ABDOMEN WITHOUT AND WITH CONTRAST (INCLUDING MRCP) TECHNIQUE: Multiplanar multisequence MR imaging of the abdomen was performed both before and after the administration of intravenous contrast. Heavily T2-weighted images of the biliary and pancreatic ducts were obtained, and three-dimensional MRCP images were rendered by post processing. CONTRAST:  106mL GADAVIST GADOBUTROL 1 MMOL/ML IV SOLN COMPARISON:  CT abdomen/pelvis dated 06/22/2020. Right upper quadrant ultrasound dated 06/23/2020. FINDINGS: Lower chest: Trace bilateral pleural effusions. Hepatobiliary: 1.5 cm T2 hyperintense lesion with peripheral nodular discontinuous enhancement and delayed wash-in in segment 7 (series 3/image 7), compatible with Lisa Morton benign hemangioma. No suspicious hepatic lesions. No hepatic steatosis. Mild layering gallstones, without associated inflammatory changes to suggest acute  cholecystitis. Fundal gallbladder wall thickening is relatively mild on MR (series 15/image 25; series 6/image 11) but may reflect benign gallbladder adenomyomatosis. Possible soft tissue in the gallbladder fundus (series 15/image 28) is nonenhancing, suggesting sludge. Known gallbladder wall calcifications are not evident on MR. No findings to suggest gallbladder adenocarcinoma. Mild central intrahepatic ductal prominence. Common duct measures 5-6 mm, within normal limits. No choledocholithiasis is seen. Pancreas:  Within normal limits. Spleen:  Within normal limits. Adrenals/Urinary Tract:  Adrenal glands are within normal limits. Subcentimeter left renal cysts. Right kidney is within normal limits. No hydronephrosis. Stomach/Bowel: Stomach is within normal limits. Visualized bowel is unremarkable. Vascular/Lymphatic:  No evidence of abdominal aortic aneurysm. No suspicious abdominal lymphadenopathy. Other:  No abdominal ascites. Musculoskeletal: No focal osseous lesions. IMPRESSION: Cholelithiasis, without associated findings to suggest acute cholecystitis. Mild central intrahepatic ductal prominence. Common duct is within normal limits. No choledocholithiasis is seen. Possible mild fundal gallbladder adenomyomatosis, equivocal. No findings to suggest gallbladder adenocarcinoma on MR. 1.5 cm benign hemangioma in segment 7. Electronically Signed   By: Julian Hy M.D.   On: 06/25/2020 07:47   US Abdomen Limited RUQ (LIVER/GB)  Result Date: 06/23/2020 CLINICAL DATA:  Mild biliary ductal dilatation with Lisa Morton questionable intraluminal density in the common duct on an abdomen and pelvis CT obtained yesterday. There were also fundal gallbladder wall calcifications and Cherilynn Schomburg subcentimeter enhancing focus in the right hepatic lobe. Also demonstrated was mild gallbladder wall thickening. EXAM: ULTRASOUND ABDOMEN LIMITED RIGHT UPPER QUADRANT COMPARISON:  Abdomen and pelvis  CT obtained yesterday. FINDINGS: Gallbladder:  Fundal wall thickening and calcifications. Anterior hypoechoic mass-like area and extending the length of the gallbladder with Lisa Scheibe maximum thickness of 1.3 cm. Possible multiple small noncalcified stones in the gallbladder neck. Common bile duct: Diameter: 5.5 mm. Not well visualized distally. No intraluminal calculus or mass visualized. Liver: Normal echotexture. No sonographically visible mass. Mild intrahepatic ductal dilatation. Portal vein is patent on color Doppler imaging with normal direction of blood flow towards the liver. Other: None. IMPRESSION: 1. Fundal gallbladder wall thickening with calcifications and possible multiple small noncalcified stones in the gallbladder neck. There is also extensive anterior mildly hypoechoic mass-like tissue in the gallbladder, concerning for Lisa Morton possible gallbladder mass such as gallbladder carcinoma. Inspissated sludge is also Adamarie Izzo possibility. 2. Mild intrahepatic biliary ductal dilatation with no sonographically visible obstructing mass or calculus. 3. No sonographic abnormality visualized to account for the subcentimeter enhancing focus seen in the right hepatic lobe on the CT. Electronically Signed   By: Claudie Revering M.D.   On: 06/23/2020 20:24    Microbiology: Recent Results (from the past 240 hour(s))  Resp Panel by RT-PCR (Flu Ajanee Buren&B, Covid) Nasopharyngeal Swab     Status: None   Collection Time: 06/22/20  9:40 PM   Specimen: Nasopharyngeal Swab; Nasopharyngeal(NP) swabs in vial transport medium  Result Value Ref Range Status   SARS Coronavirus 2 by RT PCR NEGATIVE NEGATIVE Final    Comment: (NOTE) SARS-CoV-2 target nucleic acids are NOT DETECTED.  The SARS-CoV-2 RNA is generally detectable in upper respiratory specimens during the acute phase of infection. The lowest concentration of SARS-CoV-2 viral copies this assay can detect is 138 copies/mL. Lisa Morton negative result does not preclude SARS-Cov-2 infection and should not be used as the sole basis for  treatment or other patient management decisions. Lisa Morton negative result may occur with  improper specimen collection/handling, submission of specimen other than nasopharyngeal swab, presence of viral mutation(s) within the areas targeted by this assay, and inadequate number of viral copies(<138 copies/mL). Lisa Morton negative result must be combined with clinical observations, patient history, and epidemiological information. The expected result is Negative.  Fact Sheet for Patients:  EntrepreneurPulse.com.au  Fact Sheet for Healthcare Providers:  IncredibleEmployment.be  This test is no t yet approved or cleared by the [place] FDA and  has been authorized for detection and/or diagnosis of SARS-CoV-2 by FDA under an Emergency Use Authorization (EUA). This EUA will remain  in effect (meaning this test can be used) for the duration of the COVID-19 declaration under Section 564(b)(1) of the Act, 21 U.S.C.section 360bbb-3(b)(1), unless the authorization is terminated  or revoked sooner.       Influenza Lisa Morton by PCR NEGATIVE NEGATIVE Final   Influenza B by PCR NEGATIVE NEGATIVE Final    Comment: (NOTE) The Xpert Xpress SARS-CoV-2/FLU/RSV plus assay is intended as an aid in the diagnosis of influenza from Nasopharyngeal swab specimens and should not be used as Kyllie Pettijohn sole basis for treatment. Nasal washings and aspirates are unacceptable for Xpert Xpress SARS-CoV-2/FLU/RSV testing.  Fact Sheet for Patients: EntrepreneurPulse.com.au  Fact Sheet for Healthcare Providers: IncredibleEmployment.be  This test is not yet approved or cleared by the [place] FDA and has been authorized for detection and/or diagnosis of SARS-CoV-2 by FDA under an Emergency Use Authorization (EUA). This EUA will remain in effect (meaning this test can be used) for the duration of the COVID-19 declaration under Section 564(b)(1) of the Act, 21  U.S.C. section 360bbb-3(b)(1), unless the authorization is terminated  or revoked.  Performed at [facility], [place] [address], [place], [place] [address]   Culture, blood (routine x 2)     Status: None (Preliminary result)   Collection Time: 06/23/20  2:29 AM   Specimen: BLOOD  Result Value Ref Range Status   Specimen Description   Final    BLOOD RIGHT ANTECUBITAL Performed at [place] [address], [place], [place] [address]    Special Requests   Final    BOTTLES DRAWN AEROBIC ONLY Blood Culture adequate volume Performed at [place] [address], [place], [place] [address]    Culture   Final    NO GROWTH 3 DAYS Performed at [facility], [place] [address] [place] [place], [place] [id number]    Report Status PENDING  Incomplete  Culture, blood (routine x 2)     Status: None (Preliminary result)   Collection Time: 06/23/20  2:29 AM   Specimen: BLOOD  Result Value Ref Range Status   Specimen Description   Final    BLOOD BLOOD RIGHT FOREARM Performed at [facility] [address] [place], [place] [address]    Special Requests   Final    BOTTLES DRAWN AEROBIC ONLY Blood Culture adequate volume Performed at [facility] [address], [place], [place] [address]    Culture   Final    NO GROWTH 3 DAYS Performed at [facility], [address], [place], [place] [address]    Report Status PENDING  Incomplete  Gastrointestinal Panel by PCR , Stool     Status: None   Collection Time: 06/24/20 12:20 PM   Specimen: STOOL  Result Value Ref Range Status   Campylobacter species NOT DETECTED NOT DETECTED Final   Plesimonas shigelloides NOT DETECTED NOT DETECTED Final   Salmonella species NOT DETECTED NOT DETECTED Final   Yersinia enterocolitica NOT DETECTED NOT DETECTED Final   Vibrio species NOT DETECTED NOT DETECTED Final   Vibrio cholerae NOT DETECTED NOT DETECTED  Final   Enteroaggregative Lisa Morton coli (EAEC) NOT DETECTED NOT DETECTED Final   Enteropathogenic Lisa Morton coli (EPEC) NOT DETECTED NOT DETECTED Final   Enterotoxigenic Lisa Morton coli (ETEC) NOT DETECTED NOT DETECTED Final   Shiga like toxin producing Lisa Morton coli (STEC) NOT DETECTED NOT DETECTED Final   Shigella/Enteroinvasive Lisa Morton coli (EIEC) NOT DETECTED NOT DETECTED Final   Cryptosporidium NOT DETECTED NOT DETECTED Final   Cyclospora cayetanensis NOT DETECTED NOT DETECTED Final   Entamoeba histolytica NOT DETECTED NOT DETECTED Final   Giardia lamblia NOT DETECTED NOT DETECTED Final   Adenovirus F40/41 NOT DETECTED NOT DETECTED Final   Astrovirus NOT DETECTED NOT DETECTED Final   Norovirus GI/GII NOT DETECTED NOT DETECTED Final   Rotavirus Lisa Morton NOT DETECTED NOT DETECTED Final   Sapovirus (I, II, IV, and V) NOT DETECTED NOT DETECTED Final    Comment: Performed at [facility], [address] [place], [place] [address]     Labs: Basic Metabolic Panel: Recent Labs  Lab 06/22/20 1753 06/23/20 0229 06/24/20 0601 06/25/20 0711 06/26/20 0453  NA 133* 135 136 139 137  K 4.0 3.5 3.2* 3.6 3.7  CL 100 106 108 110 107  CO2 24 21* 20* 21* 22  GLUCOSE 132* 142* 128* 110* 102*  BUN 14 10 8 8  6*  CREATININE 0.58 0.56 0.57 0.44 0.59  CALCIUM 8.6* 8.5* 8.2* 8.4* 8.6*  MG  --  1.7 1.9 1.8 2.0  PHOS  --   --  2.8 3.3 3.2   Liver Function Tests: Recent Labs  Lab 06/22/20 1753 06/23/20 0229 06/24/20 0601 06/25/20 0711 06/26/20 0453  AST 30 17 14* 12* 15  ALT 17 13 11 11 11   ALKPHOS 82 74 61 59 60  BILITOT 1.0 0.7 0.4 0.2* 0.1*  PROT 6.6 5.8* 5.1* 5.4* 5.2*  ALBUMIN 3.3* 2.9* 2.5* 2.7* 2.7*   Recent Labs  Lab 06/22/20 1753  LIPASE 29   No results for input(s): AMMONIA in the last 168 hours. CBC: Recent Labs  Lab 06/22/20 1753 06/23/20 0229 06/24/20 0601 06/25/20 0711 06/26/20 0453  WBC 17.6* 17.7* 9.0 7.4 6.8  NEUTROABS 15.0* 14.4* 6.5 4.8 4.1  HGB 11.3* 10.3* 9.5* 10.2* 10.4*  HCT 35.8*  33.5* 30.4* 33.0* 33.4*  MCV 96.0 98.5 97.7 97.1 96.8  PLT 330 293 238 266 291   Cardiac Enzymes: No results for input(s): CKTOTAL, CKMB, CKMBINDEX, TROPONINI in the last 168 hours. BNP: BNP (last 3 results) No results for input(s): BNP in the last 8760 hours.  ProBNP (last 3 results) No results for input(s): PROBNP in the last 8760 hours.  CBG: No results for input(s): GLUCAP in the last 168 hours.     Signed:  Fayrene Helper MD.  [facility] 06/26/2020, 11:31 AM

## 2020-06-28 DIAGNOSIS — K5289 Other specified noninfective gastroenteritis and colitis: Secondary | ICD-10-CM | POA: Diagnosis not present

## 2020-06-28 DIAGNOSIS — F32A Depression, unspecified: Secondary | ICD-10-CM | POA: Diagnosis not present

## 2020-06-28 DIAGNOSIS — Z96612 Presence of left artificial shoulder joint: Secondary | ICD-10-CM | POA: Diagnosis not present

## 2020-06-28 DIAGNOSIS — I5032 Chronic diastolic (congestive) heart failure: Secondary | ICD-10-CM | POA: Diagnosis not present

## 2020-06-28 DIAGNOSIS — A419 Sepsis, unspecified organism: Secondary | ICD-10-CM | POA: Diagnosis not present

## 2020-06-28 DIAGNOSIS — I10 Essential (primary) hypertension: Secondary | ICD-10-CM | POA: Diagnosis not present

## 2020-06-28 LAB — CULTURE, BLOOD (ROUTINE X 2)
Culture: NO GROWTH
Culture: NO GROWTH
Special Requests: ADEQUATE
Special Requests: ADEQUATE

## 2020-07-03 DIAGNOSIS — I10 Essential (primary) hypertension: Secondary | ICD-10-CM | POA: Diagnosis not present

## 2020-07-03 DIAGNOSIS — I5032 Chronic diastolic (congestive) heart failure: Secondary | ICD-10-CM | POA: Diagnosis not present

## 2020-07-03 DIAGNOSIS — K5289 Other specified noninfective gastroenteritis and colitis: Secondary | ICD-10-CM | POA: Diagnosis not present

## 2020-07-03 DIAGNOSIS — F32A Depression, unspecified: Secondary | ICD-10-CM | POA: Diagnosis not present

## 2020-07-03 DIAGNOSIS — Z96612 Presence of left artificial shoulder joint: Secondary | ICD-10-CM | POA: Diagnosis not present

## 2020-07-03 DIAGNOSIS — A419 Sepsis, unspecified organism: Secondary | ICD-10-CM | POA: Diagnosis not present

## 2020-07-04 DIAGNOSIS — Z5181 Encounter for therapeutic drug level monitoring: Secondary | ICD-10-CM | POA: Diagnosis not present

## 2020-07-04 DIAGNOSIS — K529 Noninfective gastroenteritis and colitis, unspecified: Secondary | ICD-10-CM | POA: Diagnosis not present

## 2020-07-04 DIAGNOSIS — E538 Deficiency of other specified B group vitamins: Secondary | ICD-10-CM | POA: Diagnosis not present

## 2020-07-04 DIAGNOSIS — E559 Vitamin D deficiency, unspecified: Secondary | ICD-10-CM | POA: Diagnosis not present

## 2020-07-04 DIAGNOSIS — Z09 Encounter for follow-up examination after completed treatment for conditions other than malignant neoplasm: Secondary | ICD-10-CM | POA: Diagnosis not present

## 2020-07-04 DIAGNOSIS — E611 Iron deficiency: Secondary | ICD-10-CM | POA: Diagnosis not present

## 2020-07-04 DIAGNOSIS — I509 Heart failure, unspecified: Secondary | ICD-10-CM | POA: Diagnosis not present

## 2020-07-05 DIAGNOSIS — I5032 Chronic diastolic (congestive) heart failure: Secondary | ICD-10-CM | POA: Diagnosis not present

## 2020-07-05 DIAGNOSIS — Z96612 Presence of left artificial shoulder joint: Secondary | ICD-10-CM | POA: Diagnosis not present

## 2020-07-05 DIAGNOSIS — I10 Essential (primary) hypertension: Secondary | ICD-10-CM | POA: Diagnosis not present

## 2020-07-05 DIAGNOSIS — F32A Depression, unspecified: Secondary | ICD-10-CM | POA: Diagnosis not present

## 2020-07-05 DIAGNOSIS — K5289 Other specified noninfective gastroenteritis and colitis: Secondary | ICD-10-CM | POA: Diagnosis not present

## 2020-07-05 DIAGNOSIS — A419 Sepsis, unspecified organism: Secondary | ICD-10-CM | POA: Diagnosis not present

## 2020-07-09 DIAGNOSIS — I5032 Chronic diastolic (congestive) heart failure: Secondary | ICD-10-CM | POA: Diagnosis not present

## 2020-07-09 DIAGNOSIS — A419 Sepsis, unspecified organism: Secondary | ICD-10-CM | POA: Diagnosis not present

## 2020-07-09 DIAGNOSIS — Z96612 Presence of left artificial shoulder joint: Secondary | ICD-10-CM | POA: Diagnosis not present

## 2020-07-09 DIAGNOSIS — F32A Depression, unspecified: Secondary | ICD-10-CM | POA: Diagnosis not present

## 2020-07-09 DIAGNOSIS — I10 Essential (primary) hypertension: Secondary | ICD-10-CM | POA: Diagnosis not present

## 2020-07-09 DIAGNOSIS — K5289 Other specified noninfective gastroenteritis and colitis: Secondary | ICD-10-CM | POA: Diagnosis not present

## 2020-07-11 DIAGNOSIS — Z96612 Presence of left artificial shoulder joint: Secondary | ICD-10-CM | POA: Diagnosis not present

## 2020-07-11 DIAGNOSIS — K5289 Other specified noninfective gastroenteritis and colitis: Secondary | ICD-10-CM | POA: Diagnosis not present

## 2020-07-11 DIAGNOSIS — A419 Sepsis, unspecified organism: Secondary | ICD-10-CM | POA: Diagnosis not present

## 2020-07-11 DIAGNOSIS — F32A Depression, unspecified: Secondary | ICD-10-CM | POA: Diagnosis not present

## 2020-07-11 DIAGNOSIS — I10 Essential (primary) hypertension: Secondary | ICD-10-CM | POA: Diagnosis not present

## 2020-07-11 DIAGNOSIS — I5032 Chronic diastolic (congestive) heart failure: Secondary | ICD-10-CM | POA: Diagnosis not present

## 2020-07-16 DIAGNOSIS — Z96612 Presence of left artificial shoulder joint: Secondary | ICD-10-CM | POA: Diagnosis not present

## 2020-07-16 DIAGNOSIS — I10 Essential (primary) hypertension: Secondary | ICD-10-CM | POA: Diagnosis not present

## 2020-07-16 DIAGNOSIS — I5032 Chronic diastolic (congestive) heart failure: Secondary | ICD-10-CM | POA: Diagnosis not present

## 2020-07-16 DIAGNOSIS — K5289 Other specified noninfective gastroenteritis and colitis: Secondary | ICD-10-CM | POA: Diagnosis not present

## 2020-07-16 DIAGNOSIS — F32A Depression, unspecified: Secondary | ICD-10-CM | POA: Diagnosis not present

## 2020-07-16 DIAGNOSIS — A419 Sepsis, unspecified organism: Secondary | ICD-10-CM | POA: Diagnosis not present

## 2020-07-17 DIAGNOSIS — M25512 Pain in left shoulder: Secondary | ICD-10-CM | POA: Diagnosis not present

## 2020-07-19 DIAGNOSIS — K529 Noninfective gastroenteritis and colitis, unspecified: Secondary | ICD-10-CM | POA: Diagnosis not present

## 2020-07-19 DIAGNOSIS — K828 Other specified diseases of gallbladder: Secondary | ICD-10-CM | POA: Diagnosis not present

## 2020-07-19 DIAGNOSIS — K388 Other specified diseases of appendix: Secondary | ICD-10-CM | POA: Diagnosis not present

## 2020-07-26 DIAGNOSIS — F32A Depression, unspecified: Secondary | ICD-10-CM | POA: Diagnosis not present

## 2020-07-26 DIAGNOSIS — I5032 Chronic diastolic (congestive) heart failure: Secondary | ICD-10-CM | POA: Diagnosis not present

## 2020-07-26 DIAGNOSIS — Z96612 Presence of left artificial shoulder joint: Secondary | ICD-10-CM | POA: Diagnosis not present

## 2020-07-26 DIAGNOSIS — K5289 Other specified noninfective gastroenteritis and colitis: Secondary | ICD-10-CM | POA: Diagnosis not present

## 2020-07-26 DIAGNOSIS — I10 Essential (primary) hypertension: Secondary | ICD-10-CM | POA: Diagnosis not present

## 2020-07-26 DIAGNOSIS — A419 Sepsis, unspecified organism: Secondary | ICD-10-CM | POA: Diagnosis not present

## 2020-07-30 DIAGNOSIS — A419 Sepsis, unspecified organism: Secondary | ICD-10-CM | POA: Diagnosis not present

## 2020-07-30 DIAGNOSIS — Z96612 Presence of left artificial shoulder joint: Secondary | ICD-10-CM | POA: Diagnosis not present

## 2020-07-30 DIAGNOSIS — K5289 Other specified noninfective gastroenteritis and colitis: Secondary | ICD-10-CM | POA: Diagnosis not present

## 2020-07-30 DIAGNOSIS — I5032 Chronic diastolic (congestive) heart failure: Secondary | ICD-10-CM | POA: Diagnosis not present

## 2020-07-30 DIAGNOSIS — I10 Essential (primary) hypertension: Secondary | ICD-10-CM | POA: Diagnosis not present

## 2020-07-30 DIAGNOSIS — F32A Depression, unspecified: Secondary | ICD-10-CM | POA: Diagnosis not present

## 2020-08-02 DIAGNOSIS — F32A Depression, unspecified: Secondary | ICD-10-CM | POA: Diagnosis not present

## 2020-08-02 DIAGNOSIS — I10 Essential (primary) hypertension: Secondary | ICD-10-CM | POA: Diagnosis not present

## 2020-08-02 DIAGNOSIS — I5032 Chronic diastolic (congestive) heart failure: Secondary | ICD-10-CM | POA: Diagnosis not present

## 2020-08-02 DIAGNOSIS — A419 Sepsis, unspecified organism: Secondary | ICD-10-CM | POA: Diagnosis not present

## 2020-08-02 DIAGNOSIS — K5289 Other specified noninfective gastroenteritis and colitis: Secondary | ICD-10-CM | POA: Diagnosis not present

## 2020-08-02 DIAGNOSIS — Z96612 Presence of left artificial shoulder joint: Secondary | ICD-10-CM | POA: Diagnosis not present

## 2020-08-28 DIAGNOSIS — M25512 Pain in left shoulder: Secondary | ICD-10-CM | POA: Diagnosis not present

## 2020-08-31 DIAGNOSIS — R197 Diarrhea, unspecified: Secondary | ICD-10-CM | POA: Diagnosis not present

## 2020-09-12 DIAGNOSIS — M25512 Pain in left shoulder: Secondary | ICD-10-CM | POA: Diagnosis not present

## 2020-09-13 ENCOUNTER — Other Ambulatory Visit: Payer: Self-pay | Admitting: Family Medicine

## 2020-09-13 ENCOUNTER — Other Ambulatory Visit: Payer: Self-pay | Admitting: Pain Medicine

## 2020-09-13 DIAGNOSIS — Z1231 Encounter for screening mammogram for malignant neoplasm of breast: Secondary | ICD-10-CM

## 2020-09-19 DIAGNOSIS — K388 Other specified diseases of appendix: Secondary | ICD-10-CM | POA: Diagnosis not present

## 2020-09-19 DIAGNOSIS — K8010 Calculus of gallbladder with chronic cholecystitis without obstruction: Secondary | ICD-10-CM | POA: Diagnosis not present

## 2020-09-20 ENCOUNTER — Telehealth: Payer: Self-pay | Admitting: *Deleted

## 2020-09-20 NOTE — Telephone Encounter (Signed)
   Name: Lisa Morton  DOB: 08-Sep-1940  MRN: [id number]  Primary Cardiologist: None  Chart reviewed as part of pre-operative protocol coverage.   She was last seen in office by Dr. Percival Spanish 05/05/2018 for evaluation of syncope and HTN. 03/2017 echo showed nl LV function and mild AI. No significant plaque by doppler in 2019 and negative neurology work up. Event monitor 03/2018 without arrhythmia. 03/2018 exercise treadmill stress without abnormalities, and though mild ST changes were noted, it was ruled overall low risk as she was able to go 9 minutes without presyncope or syncope.  She wished to defer loop monitor.  BP well controlled. No further cardiac or ischemic workup has been completed since that time, and she has not been seen back in the office with recommendation at time of last visit for follow-up as needed by pt.   Because of [name]'s past medical history and time since last visit, she will require a follow-up visit in order to better assess preoperative cardiovascular risk.  Pre-op covering staff: - Please schedule appointment and call patient to inform them. If patient already had an upcoming appointment within acceptable timeframe, please add "pre-op clearance" to the appointment notes so provider is aware. - Please contact requesting surgeon's office via preferred method (i.e, phone, fax) to inform them of need for appointment prior to surgery.  Arvil Chaco, PA-C  09/20/2020, 11:19 AM

## 2020-09-20 NOTE — Telephone Encounter (Signed)
   [facility] Pre-operative Risk Assessment    Patient Name: Lisa Morton  DOB: [birth year] MRN: [id number]     Request for surgical clearance:  What type of surgery is being performed? Laparoscopic appendectomy   When is this surgery scheduled? TBD   What type of clearance is required (medical clearance vs. Pharmacy clearance to hold med vs. Both)? Medical  Are there any medications that need to be held prior to surgery and how long?n/a   Practice name and name of physician performing surgery?  [facility]; Dr Gurney Maxin   What is the office phone number? [phone]   7.   What is the office fax number? [place] , CMA  8.   Anesthesia type (None, local, MAC, general) ? General    Lisa Morton 09/20/2020, 10:41 AM  _________________________________________________________________   (provider comments below)

## 2020-09-20 NOTE — Telephone Encounter (Signed)
Left message for the pt to call back and schedule an appt for pre op clearance. Pt's primary card is Dr. Percival Spanish, though no availability at [place] office or [facility]. I think we can offer the pt an appt at our new location [address].

## 2020-09-21 ENCOUNTER — Ambulatory Visit
Admission: RE | Admit: 2020-09-21 | Discharge: 2020-09-21 | Disposition: A | Discharge status: Home / Self Care | Payer: Medicare Other | Source: Ambulatory Visit | Attending: Family Medicine | Admitting: Family Medicine

## 2020-09-21 ENCOUNTER — Other Ambulatory Visit: Payer: Self-pay

## 2020-09-21 DIAGNOSIS — Z1231 Encounter for screening mammogram for malignant neoplasm of breast: Secondary | ICD-10-CM

## 2020-09-21 NOTE — Telephone Encounter (Signed)
Left message to please call the office as we will need to set up an appt for pre op clearance. Primary card is Dr. Percival Spanish. No appts available at either the [facility] or [facility] st location. I was going to offer the pt an appt at the [facility] location.

## 2020-09-21 NOTE — Telephone Encounter (Signed)
2nd attempt to reach pt regarding surgical clearance and the need for appointment.  Left message for pt to call back to schedule.

## 2020-09-21 NOTE — Telephone Encounter (Signed)
Notes have been handed to me in regards to her surgery. Will hold onto these records until we have an appt for the pt.

## 2020-09-24 NOTE — Telephone Encounter (Signed)
I have handed the notes that were faxed to our office to clinic Supervisor Janan Halter, RN, BSN. These notes will need to stay with our chart prep team until we have an appt for the for per op. Once appt has been made then the notes may be forward onto provider.

## 2020-09-24 NOTE — Telephone Encounter (Signed)
Left message for pt to call the office to schedule a pre op appt with Dr. Percival Spanish or APP.

## 2020-09-26 NOTE — Telephone Encounter (Signed)
Have made several attempts to reach patient regarding surgical clearance and the need of an appointment, but have been unsuccessful.  We will get out of preop pool.  Will route back to the requesting surgeon's office to make them aware and maybe they can have pt call to schedule.

## 2020-10-03 DIAGNOSIS — M25512 Pain in left shoulder: Secondary | ICD-10-CM | POA: Diagnosis not present

## 2020-10-09 DIAGNOSIS — M25512 Pain in left shoulder: Secondary | ICD-10-CM | POA: Diagnosis not present

## 2020-10-10 DIAGNOSIS — M25512 Pain in left shoulder: Secondary | ICD-10-CM | POA: Diagnosis not present

## 2020-10-19 DIAGNOSIS — M25512 Pain in left shoulder: Secondary | ICD-10-CM | POA: Diagnosis not present

## 2020-10-30 DIAGNOSIS — M25512 Pain in left shoulder: Secondary | ICD-10-CM | POA: Diagnosis not present

## 2020-11-01 DIAGNOSIS — K389 Disease of appendix, unspecified: Secondary | ICD-10-CM | POA: Diagnosis not present

## 2020-12-11 DIAGNOSIS — M25512 Pain in left shoulder: Secondary | ICD-10-CM | POA: Diagnosis not present

## 2021-01-01 DIAGNOSIS — M25512 Pain in left shoulder: Secondary | ICD-10-CM | POA: Diagnosis not present

## 2021-03-04 DIAGNOSIS — R7303 Prediabetes: Secondary | ICD-10-CM | POA: Diagnosis not present

## 2021-03-04 DIAGNOSIS — D509 Iron deficiency anemia, unspecified: Secondary | ICD-10-CM | POA: Diagnosis not present

## 2021-03-04 DIAGNOSIS — E785 Hyperlipidemia, unspecified: Secondary | ICD-10-CM | POA: Diagnosis not present

## 2021-03-04 DIAGNOSIS — Z0000 Encounter for general adult medical examination without abnormal findings: Secondary | ICD-10-CM | POA: Diagnosis not present

## 2021-03-04 DIAGNOSIS — G2581 Restless legs syndrome: Secondary | ICD-10-CM | POA: Diagnosis not present

## 2021-03-04 DIAGNOSIS — M79676 Pain in unspecified toe(s): Secondary | ICD-10-CM | POA: Diagnosis not present

## 2021-03-04 DIAGNOSIS — E559 Vitamin D deficiency, unspecified: Secondary | ICD-10-CM | POA: Diagnosis not present

## 2021-03-04 DIAGNOSIS — Z23 Encounter for immunization: Secondary | ICD-10-CM | POA: Diagnosis not present

## 2021-03-04 DIAGNOSIS — E538 Deficiency of other specified B group vitamins: Secondary | ICD-10-CM | POA: Diagnosis not present

## 2021-03-04 DIAGNOSIS — I10 Essential (primary) hypertension: Secondary | ICD-10-CM | POA: Diagnosis not present

## 2021-06-12 DIAGNOSIS — E785 Hyperlipidemia, unspecified: Secondary | ICD-10-CM | POA: Diagnosis not present

## 2021-06-12 DIAGNOSIS — I10 Essential (primary) hypertension: Secondary | ICD-10-CM | POA: Diagnosis not present

## 2021-06-14 ENCOUNTER — Other Ambulatory Visit: Payer: Self-pay | Admitting: Family Medicine

## 2021-06-14 DIAGNOSIS — M8580 Other specified disorders of bone density and structure, unspecified site: Secondary | ICD-10-CM

## 2021-08-20 DIAGNOSIS — M8580 Other specified disorders of bone density and structure, unspecified site: Secondary | ICD-10-CM | POA: Diagnosis not present

## 2021-08-20 DIAGNOSIS — G4700 Insomnia, unspecified: Secondary | ICD-10-CM | POA: Diagnosis not present

## 2021-08-20 DIAGNOSIS — I10 Essential (primary) hypertension: Secondary | ICD-10-CM | POA: Diagnosis not present

## 2021-08-20 DIAGNOSIS — E785 Hyperlipidemia, unspecified: Secondary | ICD-10-CM | POA: Diagnosis not present

## 2021-09-02 DIAGNOSIS — I10 Essential (primary) hypertension: Secondary | ICD-10-CM | POA: Diagnosis not present

## 2021-09-02 DIAGNOSIS — I700 Atherosclerosis of aorta: Secondary | ICD-10-CM | POA: Diagnosis not present

## 2021-09-02 DIAGNOSIS — G2581 Restless legs syndrome: Secondary | ICD-10-CM | POA: Diagnosis not present

## 2021-09-02 DIAGNOSIS — E538 Deficiency of other specified B group vitamins: Secondary | ICD-10-CM | POA: Diagnosis not present

## 2021-09-02 DIAGNOSIS — R7303 Prediabetes: Secondary | ICD-10-CM | POA: Diagnosis not present

## 2021-09-02 DIAGNOSIS — E559 Vitamin D deficiency, unspecified: Secondary | ICD-10-CM | POA: Diagnosis not present

## 2021-09-02 DIAGNOSIS — D509 Iron deficiency anemia, unspecified: Secondary | ICD-10-CM | POA: Diagnosis not present

## 2021-11-07 DIAGNOSIS — G4700 Insomnia, unspecified: Secondary | ICD-10-CM | POA: Diagnosis not present

## 2021-11-07 DIAGNOSIS — I10 Essential (primary) hypertension: Secondary | ICD-10-CM | POA: Diagnosis not present

## 2021-11-07 DIAGNOSIS — E785 Hyperlipidemia, unspecified: Secondary | ICD-10-CM | POA: Diagnosis not present

## 2021-11-07 DIAGNOSIS — M8580 Other specified disorders of bone density and structure, unspecified site: Secondary | ICD-10-CM | POA: Diagnosis not present

## 2021-11-25 ENCOUNTER — Other Ambulatory Visit: Payer: Self-pay | Admitting: Family Medicine

## 2021-11-27 DIAGNOSIS — I10 Essential (primary) hypertension: Secondary | ICD-10-CM | POA: Diagnosis not present

## 2021-11-27 DIAGNOSIS — R531 Weakness: Secondary | ICD-10-CM | POA: Diagnosis not present

## 2021-11-27 DIAGNOSIS — N6320 Unspecified lump in the left breast, unspecified quadrant: Secondary | ICD-10-CM | POA: Diagnosis not present

## 2021-11-27 DIAGNOSIS — R262 Difficulty in walking, not elsewhere classified: Secondary | ICD-10-CM | POA: Diagnosis not present

## 2021-11-29 ENCOUNTER — Other Ambulatory Visit: Payer: Self-pay | Admitting: Family Medicine

## 2021-11-29 DIAGNOSIS — N63 Unspecified lump in unspecified breast: Secondary | ICD-10-CM

## 2021-12-02 DIAGNOSIS — R262 Difficulty in walking, not elsewhere classified: Secondary | ICD-10-CM | POA: Diagnosis not present

## 2021-12-30 ENCOUNTER — Ambulatory Visit
Admission: RE | Admit: 2021-12-30 | Discharge: 2021-12-30 | Disposition: A | Discharge status: Home / Self Care | Payer: Medicare Other | Source: Ambulatory Visit | Attending: Family Medicine | Admitting: Family Medicine

## 2021-12-30 ENCOUNTER — Ambulatory Visit: Payer: Medicare Other

## 2021-12-30 DIAGNOSIS — R92323 Mammographic fibroglandular density, bilateral breasts: Secondary | ICD-10-CM | POA: Diagnosis not present

## 2021-12-30 DIAGNOSIS — N63 Unspecified lump in unspecified breast: Secondary | ICD-10-CM

## 2021-12-30 DIAGNOSIS — N6489 Other specified disorders of breast: Secondary | ICD-10-CM | POA: Diagnosis not present

## 2022-01-02 DIAGNOSIS — H26492 Other secondary cataract, left eye: Secondary | ICD-10-CM | POA: Diagnosis not present

## 2022-01-02 DIAGNOSIS — H524 Presbyopia: Secondary | ICD-10-CM | POA: Diagnosis not present

## 2022-01-02 DIAGNOSIS — H0102B Squamous blepharitis left eye, upper and lower eyelids: Secondary | ICD-10-CM | POA: Diagnosis not present

## 2022-01-02 DIAGNOSIS — H0102A Squamous blepharitis right eye, upper and lower eyelids: Secondary | ICD-10-CM | POA: Diagnosis not present

## 2022-01-13 DIAGNOSIS — H26492 Other secondary cataract, left eye: Secondary | ICD-10-CM | POA: Diagnosis not present

## 2022-01-13 DIAGNOSIS — Z961 Presence of intraocular lens: Secondary | ICD-10-CM | POA: Diagnosis not present

## 2022-03-26 DIAGNOSIS — I700 Atherosclerosis of aorta: Secondary | ICD-10-CM | POA: Diagnosis not present

## 2022-03-26 DIAGNOSIS — E785 Hyperlipidemia, unspecified: Secondary | ICD-10-CM | POA: Diagnosis not present

## 2022-03-26 DIAGNOSIS — G2581 Restless legs syndrome: Secondary | ICD-10-CM | POA: Diagnosis not present

## 2022-03-26 DIAGNOSIS — Z0000 Encounter for general adult medical examination without abnormal findings: Secondary | ICD-10-CM | POA: Diagnosis not present

## 2022-03-26 DIAGNOSIS — R7303 Prediabetes: Secondary | ICD-10-CM | POA: Diagnosis not present

## 2022-03-26 DIAGNOSIS — E559 Vitamin D deficiency, unspecified: Secondary | ICD-10-CM | POA: Diagnosis not present

## 2022-03-26 DIAGNOSIS — D509 Iron deficiency anemia, unspecified: Secondary | ICD-10-CM | POA: Diagnosis not present

## 2022-03-26 DIAGNOSIS — E538 Deficiency of other specified B group vitamins: Secondary | ICD-10-CM | POA: Diagnosis not present

## 2022-03-26 DIAGNOSIS — I10 Essential (primary) hypertension: Secondary | ICD-10-CM | POA: Diagnosis not present

## 2022-03-26 DIAGNOSIS — L309 Dermatitis, unspecified: Secondary | ICD-10-CM | POA: Diagnosis not present

## 2022-09-17 DIAGNOSIS — L218 Other seborrheic dermatitis: Secondary | ICD-10-CM | POA: Diagnosis not present

## 2022-10-06 DIAGNOSIS — R7303 Prediabetes: Secondary | ICD-10-CM | POA: Diagnosis not present

## 2022-10-06 DIAGNOSIS — R609 Edema, unspecified: Secondary | ICD-10-CM | POA: Diagnosis not present

## 2022-10-06 DIAGNOSIS — I110 Hypertensive heart disease with heart failure: Secondary | ICD-10-CM | POA: Diagnosis not present

## 2022-10-06 DIAGNOSIS — R0602 Shortness of breath: Secondary | ICD-10-CM | POA: Diagnosis not present

## 2022-10-06 DIAGNOSIS — I509 Heart failure, unspecified: Secondary | ICD-10-CM | POA: Diagnosis not present

## 2022-10-08 DIAGNOSIS — D2371 Other benign neoplasm of skin of right lower limb, including hip: Secondary | ICD-10-CM | POA: Diagnosis not present

## 2022-10-08 DIAGNOSIS — D2271 Melanocytic nevi of right lower limb, including hip: Secondary | ICD-10-CM | POA: Diagnosis not present

## 2022-10-08 DIAGNOSIS — L821 Other seborrheic keratosis: Secondary | ICD-10-CM | POA: Diagnosis not present

## 2022-10-08 DIAGNOSIS — L218 Other seborrheic dermatitis: Secondary | ICD-10-CM | POA: Diagnosis not present

## 2022-10-08 DIAGNOSIS — D1801 Hemangioma of skin and subcutaneous tissue: Secondary | ICD-10-CM | POA: Diagnosis not present

## 2022-10-09 ENCOUNTER — Other Ambulatory Visit: Payer: Self-pay | Admitting: Family Medicine

## 2022-10-09 DIAGNOSIS — R600 Localized edema: Secondary | ICD-10-CM

## 2022-10-20 ENCOUNTER — Ambulatory Visit: Admission: RE | Admit: 2022-10-20 | Payer: Medicare Other | Source: Ambulatory Visit

## 2022-10-20 DIAGNOSIS — I82409 Acute embolism and thrombosis of unspecified deep veins of unspecified lower extremity: Secondary | ICD-10-CM | POA: Diagnosis not present

## 2022-10-20 DIAGNOSIS — R600 Localized edema: Secondary | ICD-10-CM

## 2022-10-29 NOTE — Progress Notes (Unsigned)
Cardiology Office Note   Date:  10/30/2022   ID:  Lisa Morton, DOB 01-25-1941, MRN [id number]  PCP:  Shirlean Mylar, MD  Cardiologist:   None Referring:  Farris Has, MD  Chief Complaint  Patient presents with   Shortness of Breath      History of Present Illness: Lisa Morton is a 82 y.o. female who was referred by Shirlean Mylar, MD for evaluation of syncope in 2019.  I have not seen her since 2020  She had a negative work up including a POET (Plain Old Exercise Treadmill), event monitor and echo.   She is referred now for evaluation of SOB and edema.     She says she has not passed out since I last saw her but she was only doing that when she was walking and she does not walk very much anymore.  She has balance issues and walks with a cane.  She still does her chores that she lives alone since her husband died.  She states she gets short of breath and was dyspneic walking into the office today.  She is not describing PND or orthopnea.  She is not describing chest pressure, neck or arm discomfort.  She has had no palpitations, presyncope or syncope.  She says she has had slowly progressive lower extremity swelling.  Her feet are on the ground a lot.  She thinks the edema is in her right greater than left leg.  She does not keep her feet elevated.  She cannot wear compression stockings because she cannot get them on.  She does not eat a ton of salt.    Of note her saturation is 93 to 94% ambulating around the office.  Past Medical History:  Diagnosis Date   Chronic diastolic CHF (congestive heart failure) (HCC) 06/23/2020   Closed fracture of left proximal humerus 06/04/2020   Depression    GERD (gastroesophageal reflux disease)    Rare   History of migraine    History of syncope    Full negative cardiac workup   Hyperlipemia    Hypertension    Numbness    Left heel, sharp itching pain   OA (osteoarthritis)    lumbar spine, left hip   Pre-diabetes    Restless leg  syndrome    on klonidipin    Past Surgical History:  Procedure Laterality Date   ABDOMINAL HYSTERECTOMY  1984   CATARACT EXTRACTION  07/2011   CESAREAN SECTION     x2   COLONOSCOPY     CYSTOCELE REPAIR  01/26/2012   Procedure: ANTERIOR REPAIR (CYSTOCELE);  Surgeon: Geryl Rankins, MD;  Location: [facility];  Service: Gynecology;  Laterality: N/A;   REVERSE SHOULDER ARTHROPLASTY Left 06/04/2020   Procedure: REVERSE SHOULDER ARTHROPLASTY;  Surgeon: Ernest Mallick, MD;  Location: [facility];  Service: Orthopedics;  Laterality: Left;  with block     Current Outpatient Medications  Medication Sig Dispense Refill   buPROPion (WELLBUTRIN XL) 150 MG 24 hr tablet Take 150 mg by mouth daily.     Calcium-Magnesium-Zinc (CAL-MAG-ZINC PO) Take 1 tablet by mouth daily.     Cholecalciferol (VITAMIN D) 50 MCG (2000 UT) CAPS Take 2,000 Units by mouth daily.     furosemide (LASIX) 20 MG tablet Take 1 tablet (20 mg total) by mouth daily as needed. 30 tablet 3   lisinopril (PRINIVIL,ZESTRIL) 5 MG tablet Take 5 mg by mouth daily.     Multiple Vitamins-Minerals (CENTRUM SILVER ADULT 50+  PO) Take 1 tablet by mouth daily.     OVER THE COUNTER MEDICATION Take 1 capsule by mouth 3 (three) times a week. Blood Builder- contains Vitamin C, Folate, Vitamin B12, and Iron. Takes on Wed and Sunday.     Red Yeast Rice 600 MG CAPS Take 1,200 mg by mouth in the morning and at bedtime.     rOPINIRole (REQUIP) 1 MG tablet Take 1 mg by mouth at bedtime.     sertraline (ZOLOFT) 100 MG tablet Take 100 mg by mouth daily.     vitamin B-12 (CYANOCOBALAMIN) 1000 MCG tablet Take 1,000 mcg by mouth 4 (four) times a week.      clonazePAM (KLONOPIN) 1 MG tablet Take 1 mg by mouth at bedtime.  (Patient not taking: Reported on 10/30/2022)     HYDROcodone-acetaminophen (NORCO/VICODIN) 5-325 MG tablet Take 1-2 tablets by mouth every 4 (four) hours as needed for moderate pain (pain score 4-6). (Patient not taking: Reported on 10/30/2022) 30  tablet 0   ondansetron (ZOFRAN ODT) 4 MG disintegrating tablet Take 1 tablet (4 mg total) by mouth every 8 (eight) hours as needed for nausea or vomiting. (Patient not taking: Reported on 10/30/2022) 20 tablet 0   No current facility-administered medications for this visit.    Allergies:   Codeine, Latex, and Penicillins    Social History:  The patient  reports that she has never smoked. She has never used smokeless tobacco. She reports that she does not drink alcohol and does not use drugs.   Family History:  The patient's family history includes Bipolar disorder in her sister; Breast cancer in her maternal grandmother; Cancer in her mother; Heart attack in her father.    ROS:  Please see the history of present illness.   Otherwise, review of systems are positive for none.   All other systems are reviewed and negative.    PHYSICAL EXAM: VS:  BP 126/82   Pulse 87   Ht 5\' 1"  (1.549 m)   Wt 155 lb 3.2 oz (70.4 kg)   SpO2 97%   BMI 29.32 kg/m  , BMI Body mass index is 29.32 kg/m. GENERAL:  Frail appearing HEENT:  Pupils equal round and reactive, fundi not visualized, oral mucosa unremarkable NECK:  Positive  jugular venous distention, waveform within normal limits, carotid upstroke brisk and symmetric, no bruits, no thyromegaly LYMPHATICS:  No cervical, inguinal adenopathy LUNGS:  Clear to auscultation bilaterally BACK:  No CVA tenderness CHEST:  Unremarkable HEART:  PMI not displaced or sustained,S1 and S2 within normal limits, no S3, no S4, no clicks, no rubs, no murmurs ABD:  Flat, positive bowel sounds normal in frequency in pitch, no bruits, no rebound, no guarding, no midline pulsatile mass, no hepatomegaly, no splenomegaly EXT:  2 plus pulses throughout, moderate bilateral lower extremity edema to below the knees, no cyanosis no clubbing SKIN:  No rashes no nodules NEURO:  Cranial nerves II through XII grossly intact, motor grossly intact throughout [facility]:  Cognitively  intact, oriented to person place and time    EKG:  EKG Interpretation Date/Time:  Thursday October 30 2022 13:03:38 EDT Ventricular Rate:  87 PR Interval:  156 QRS Duration:  90 QT Interval:  362 QTC Calculation: 435 R Axis:   48  Text Interpretation: Normal sinus rhythm Nonspecific T wave abnormality When compared with ECG of 23-Jun-2020 00:44, No significant change since last tracing Confirmed by Rollene Rotunda ([id number]) on 10/30/2022 1:16:47 PM     Recent Labs: No  results found for requested labs within last 365 days.    Lipid Panel No results found for: "CHOL", "TRIG", "HDL", "CHOLHDL", "VLDL", "LDLCALC", "LDLDIRECT"    Wt Readings from Last 3 Encounters:  10/30/22 155 lb 3.2 oz (70.4 kg)  06/22/20 136 lb (61.7 kg)  06/04/20 136 lb (61.7 kg)      Other studies Reviewed: Additional studies/ records that were reviewed today include: Labs. Review of the above records demonstrates:  Please see elsewhere in the note.     ASSESSMENT AND PLAN:  SOB/edema I suspect that this is multifactorial may be related to aging deconditioning.: I am going to start with a BNP level echocardiogram.   I do not think she can get the compression socks on.  We talked about keeping her feet elevated.  We talked about low salt.  I am to give her Lasix 20 mg a day for 3 days and then she can use it as needed.  If her echocardiogram is unremarkable I will probably follow-up with pulmonary function testing.  HTN: Her blood pressure is controlled.  No change in therapy.  Edema: See above   Current medicines are reviewed at length with the patient today.  The patient does not have concerns regarding medicines.  The following changes have been made:  no change  Labs/ tests ordered today include:   Orders Placed This Encounter  Procedures   Brain natriuretic peptide   EKG 12-Lead   ECHOCARDIOGRAM COMPLETE     Disposition:   FU with APP in a couple of months.      Signed, Rollene Rotunda,  MD  10/30/2022 1:56 PM    [facility]

## 2022-10-30 ENCOUNTER — Encounter: Payer: Self-pay | Admitting: Cardiology

## 2022-10-30 ENCOUNTER — Ambulatory Visit: Payer: Medicare Other | Attending: Cardiology | Admitting: Cardiology

## 2022-10-30 VITALS — BP 126/82 | HR 87 | Ht 61.0 in | Wt 155.2 lb

## 2022-10-30 DIAGNOSIS — I10 Essential (primary) hypertension: Secondary | ICD-10-CM

## 2022-10-30 DIAGNOSIS — E785 Hyperlipidemia, unspecified: Secondary | ICD-10-CM | POA: Diagnosis not present

## 2022-10-30 DIAGNOSIS — R0602 Shortness of breath: Secondary | ICD-10-CM | POA: Diagnosis not present

## 2022-10-30 DIAGNOSIS — R600 Localized edema: Secondary | ICD-10-CM | POA: Diagnosis not present

## 2022-10-30 MED ORDER — FUROSEMIDE 20 MG PO TABS: 20.0000 mg | ORAL_TABLET | Freq: Every day | ORAL | 3 refills | Status: DC | PRN

## 2022-10-30 NOTE — Patient Instructions (Addendum)
Medication Instructions:  Dr. Antoine Poche has prescribed FUROSEMIDE (LASIX) 20mg  -- take this for 3 days in a row and then use once daily as needed   *If you need a refill on your cardiac medications before your next appointment, please call your pharmacy*   Lab Work: Non-Fasting BNP today   If you have labs (blood work) drawn today and your tests are completely normal, you will receive your results only by: MyChart Message (if you have MyChart) OR A paper copy in the mail If you have any lab test that is abnormal or we need to change your treatment, we will call you to review the results.   Testing/Procedures: Your physician has requested that you have an echocardiogram. Echocardiography is a painless test that uses sound waves to create images of your heart. It provides your doctor with information about the size and shape of your heart and how well your heart's chambers and valves are working. This procedure takes approximately one hour. There are no restrictions for this procedure. Please do NOT wear cologne, perfume, aftershave, or lotions (deodorant is allowed). Please arrive 15 minutes prior to your appointment time. Test locations in [facility] - [address] 3rd Floor [facility] - [address]   Follow-Up: At [facility], you and your health needs are our priority.  As part of our continuing mission to provide you with exceptional heart care, we have created designated Provider Care Teams.  These Care Teams include your primary Cardiologist (physician) and Advanced Practice Providers (APPs -  Physician Assistants and Nurse Practitioners) who all work together to provide you with the care you need, when you need it.  We recommend signing up for the patient portal called "MyChart".  Sign up information is provided on this After Visit Summary.  MyChart is used to connect with patients for Virtual Visits (Telemedicine).  Patients  are able to view lab/test results, encounter notes, upcoming appointments, etc.  Non-urgent messages can be sent to your provider as well.   To learn more about what you can do with MyChart, go to [email or web address].    Your next appointment:    2 months with PA or NP

## 2022-10-31 LAB — BRAIN NATRIURETIC PEPTIDE: BNP: 96 pg/mL (ref 0.0–100.0)

## 2022-11-04 ENCOUNTER — Encounter: Payer: Self-pay | Admitting: *Deleted

## 2022-11-12 ENCOUNTER — Telehealth: Payer: Self-pay | Admitting: Cardiology

## 2022-11-12 NOTE — Telephone Encounter (Signed)
Pt c/o medication issue:  1. Name of Medication: furosemide (LASIX) 20 MG tablet   2. How are you currently taking this medication (dosage and times per day)?   Take 1 tablet (20 mg total) by mouth daily as needed.    3. Are you having a reaction (difficulty breathing--STAT)? No  4. What is your medication issue? Patient stated she has picked up the medication, but she has not started taking the medication yet. Patient want to make Dr. Antoine Poche aware of this. Patient did mentioned she is wary about starting the medication.

## 2022-11-12 NOTE — Telephone Encounter (Signed)
Called patient and LM to call office.

## 2022-11-13 NOTE — Telephone Encounter (Signed)
Called to patient and goes straight to VM.  LM to call office. Will let provider know as Lorain Childes

## 2022-11-18 ENCOUNTER — Ambulatory Visit (HOSPITAL_COMMUNITY): Payer: Medicare Other | Attending: Cardiology

## 2022-11-18 DIAGNOSIS — R0602 Shortness of breath: Secondary | ICD-10-CM | POA: Diagnosis not present

## 2022-11-18 DIAGNOSIS — I10 Essential (primary) hypertension: Secondary | ICD-10-CM | POA: Diagnosis not present

## 2022-11-18 DIAGNOSIS — R600 Localized edema: Secondary | ICD-10-CM | POA: Insufficient documentation

## 2022-11-18 LAB — ECHOCARDIOGRAM COMPLETE
Area-P 1/2: 4.96 cm2
P 1/2 time: 444 ms
S' Lateral: 2 cm

## 2022-11-19 NOTE — Telephone Encounter (Signed)
Call goes straight to VM  Please tell patient that message was sent to Doctor and we do not need a call back.

## 2022-11-19 NOTE — Telephone Encounter (Signed)
Patient returned staff call and stated she re-started her phone and calls should go through now.

## 2022-11-20 ENCOUNTER — Telehealth: Payer: Self-pay | Admitting: Cardiology

## 2022-11-20 NOTE — Telephone Encounter (Signed)
Patient is returning call to discuss echo results. °

## 2022-11-20 NOTE — Telephone Encounter (Signed)
Goes straight to Vm. LM to call office

## 2022-11-20 NOTE — Telephone Encounter (Signed)
Patient given echo results.  Advised that someone from pulmonary will call her to schedule once referral competed

## 2022-11-26 ENCOUNTER — Other Ambulatory Visit: Payer: Self-pay | Admitting: *Deleted

## 2022-11-26 DIAGNOSIS — R0602 Shortness of breath: Secondary | ICD-10-CM

## 2022-11-27 ENCOUNTER — Telehealth: Payer: Self-pay | Admitting: *Deleted

## 2022-11-27 NOTE — Telephone Encounter (Signed)
-----   Message from Rollene Rotunda sent at 11/18/2022  7:28 PM EDT ----- The EF is well preserved and the BNP was not elevated.  I would like to order PFTs please.  Call Ms. Clementeen Graham with the results and send results to Shirlean Mylar, MD

## 2022-11-27 NOTE — Telephone Encounter (Signed)
Left message for pt to call, PFT is scheduled for Wednesday next week, 12/03/22 @ 10 am at [facility]. She will go to the main entrance of [facility] and check in at the admitting office and then they will take here to resp. Therapy. No restrictions prior to test, do not use inhalers prior to testing unless emergency situation.

## 2022-12-01 NOTE — Telephone Encounter (Signed)
Left message for pt to call and date and time of PFT

## 2022-12-03 ENCOUNTER — Encounter (HOSPITAL_COMMUNITY): Payer: Medicare Other

## 2022-12-08 NOTE — Telephone Encounter (Signed)
PFT's have been rescheduled

## 2022-12-15 ENCOUNTER — Ambulatory Visit (HOSPITAL_COMMUNITY)
Admission: RE | Admit: 2022-12-15 | Discharge: 2022-12-15 | Disposition: A | Discharge status: Home / Self Care | Payer: Medicare Other | Source: Ambulatory Visit | Attending: Cardiology | Admitting: Cardiology

## 2022-12-15 DIAGNOSIS — R0602 Shortness of breath: Secondary | ICD-10-CM | POA: Diagnosis not present

## 2022-12-15 LAB — PULMONARY FUNCTION TEST
DL/VA % pred: 96 %
DL/VA: 3.99 ml/min/mmHg/L
DLCO unc % pred: 66 %
DLCO unc: 11.03 ml/min/mmHg
FEF 25-75 Post: 1.48 L/s
FEF 25-75 Pre: 1.5 L/s
FEF2575-%Change-Post: -1 %
FEF2575-%Pred-Post: 142 %
FEF2575-%Pred-Pre: 144 %
FEV1-%Change-Post: 2 %
FEV1-%Pred-Post: 102 %
FEV1-%Pred-Pre: 100 %
FEV1-Post: 1.55 L
FEV1-Pre: 1.52 L
FEV1FVC-%Change-Post: -2 %
FEV1FVC-%Pred-Pre: 110 %
FEV6-%Change-Post: 4 %
FEV6-%Pred-Post: 102 %
FEV6-%Pred-Pre: 97 %
FEV6-Post: 1.98 L
FEV6-Pre: 1.89 L
FEV6FVC-%Pred-Post: 106 %
FEV6FVC-%Pred-Pre: 106 %
FVC-%Change-Post: 4 %
FVC-%Pred-Post: 95 %
FVC-%Pred-Pre: 91 %
FVC-Post: 1.98 L
Post FEV1/FVC ratio: 79 %
Post FEV6/FVC ratio: 100 %
Pre FEV1/FVC ratio: 80 %
Pre FEV6/FVC Ratio: 100 %
RV % pred: 64 %
RV: 1.5 L
TLC % pred: 75 %
TLC: 3.47 L

## 2022-12-15 MED ORDER — ALBUTEROL SULFATE (2.5 MG/3ML) 0.083% IN NEBU
2.5000 mg | INHALATION_SOLUTION | Freq: Once | RESPIRATORY_TRACT | Status: AC
Start: 2022-12-15 — End: 2022-12-15
  Administered 2022-12-15: 2.5 mg via RESPIRATORY_TRACT

## 2022-12-22 ENCOUNTER — Other Ambulatory Visit: Payer: Self-pay | Admitting: *Deleted

## 2022-12-22 DIAGNOSIS — R942 Abnormal results of pulmonary function studies: Secondary | ICD-10-CM

## 2022-12-30 ENCOUNTER — Encounter: Payer: Self-pay | Admitting: Nurse Practitioner

## 2022-12-30 ENCOUNTER — Ambulatory Visit: Payer: Medicare Other | Attending: Nurse Practitioner | Admitting: Nurse Practitioner

## 2022-12-30 VITALS — BP 122/70 | HR 84 | Ht 61.0 in | Wt 149.2 lb

## 2022-12-30 DIAGNOSIS — R0602 Shortness of breath: Secondary | ICD-10-CM

## 2022-12-30 DIAGNOSIS — R600 Localized edema: Secondary | ICD-10-CM | POA: Diagnosis not present

## 2022-12-30 DIAGNOSIS — I10 Essential (primary) hypertension: Secondary | ICD-10-CM

## 2022-12-30 DIAGNOSIS — I351 Nonrheumatic aortic (valve) insufficiency: Secondary | ICD-10-CM | POA: Diagnosis not present

## 2022-12-30 NOTE — Progress Notes (Signed)
Office Visit    Patient Name: Lisa Morton Date of Encounter: 12/30/2022  Primary Care Provider:  Farris Has, MD Primary Cardiologist:  Rollene Rotunda, MD  Chief Complaint    82 year old female with a history of syncope, bilateral lower extremity edema, aortic insufficiency, hypertension, hyperlipidemia, restless leg syndrome OA, and GERD who presents for follow-up related to lower extremity edema and shortness of breath.   Past Medical History    Past Medical History:  Diagnosis Date   Chronic diastolic CHF (congestive heart failure) (HCC) 06/23/2020   Closed fracture of left proximal humerus 06/04/2020   Depression    GERD (gastroesophageal reflux disease)    Rare   History of migraine    History of syncope    Full negative cardiac workup   Hyperlipemia    Hypertension    Numbness    Left heel, sharp itching pain   OA (osteoarthritis)    lumbar spine, left hip   Pre-diabetes    Restless leg syndrome    on klonidipin   Past Surgical History:  Procedure Laterality Date   ABDOMINAL HYSTERECTOMY  1984   CATARACT EXTRACTION  07/2011   CESAREAN SECTION     x2   COLONOSCOPY     CYSTOCELE REPAIR  01/26/2012   Procedure: ANTERIOR REPAIR (CYSTOCELE);  Surgeon: Geryl Rankins, MD;  Location: [facility];  Service: Gynecology;  Laterality: N/A;   REVERSE SHOULDER ARTHROPLASTY Left 06/04/2020   Procedure: REVERSE SHOULDER ARTHROPLASTY;  Surgeon: Ernest Mallick, MD;  Location: [facility];  Service: Orthopedics;  Laterality: Left;  with block    Allergies  Allergies  Allergen Reactions   Codeine Nausea And Vomiting   Latex Rash    Prolonged exposure to skin   Penicillins Rash     Labs/Other Studies Reviewed    The following studies were reviewed today:  Cardiac Studies & Procedures     STRESS TESTS  EXERCISE TOLERANCE TEST (ETT) 04/14/2018  Narrative  Blood pressure demonstrated a normal response to exercise.  Upsloping/horizontal ST segment depression  ST segment depression of 2 mm was noted during stress in the II, III, aVF, V5 and V6 leads, and returning to baseline after 1-5 minutes of recovery.  Note, there was mild ST segment scooping/nonspecific ST-T segment changes noted at baseline.  Abnormal exercise treadmill test secondary to ST segment changes that are suggestive of ischemia.  Donato Schultz, MD   ECHOCARDIOGRAM  ECHOCARDIOGRAM COMPLETE 11/18/2022  Narrative ECHOCARDIOGRAM REPORT    Patient Name:   Lisa Morton Date of Exam: 11/18/2022 Medical Rec #:  [id number]        Height:       61.0 in Accession #:    [id number]       Weight:       155.2 lb Date of Birth:  02-28-1941        BSA:          1.696 m Patient Age:    82 years         BP:           126/82 mmHg Patient Gender: F                HR:           81 bpm. Exam Location:  [address]  Procedure: 2D Echo, Cardiac Doppler, Color Doppler and Strain Analysis  Indications:    R06.00 SOB  History:        Patient has prior history  of Echocardiogram examinations, most recent 01/08/2018. Signs/Symptoms:Shortness of Breath and Edema; Risk Factors:Hypertension and Dyslipidemia.  Sonographer:    Samule Ohm RDCS Referring Phys: [id number] JAMES HOCHREIN  IMPRESSIONS   1. Left ventricular ejection fraction, by estimation, is 60 to 65%. The left ventricle has normal function. The left ventricle has no regional wall motion abnormalities. Left ventricular diastolic parameters were normal. The average left ventricular global longitudinal strain is 16.0 %. The global longitudinal strain is abnormal. 2. Right ventricular systolic function is normal. The right ventricular size is normal. 3. The mitral valve is normal in structure. Trivial mitral valve regurgitation. No evidence of mitral stenosis. 4. The aortic valve is tricuspid. Aortic valve regurgitation is mild. Aortic valve sclerosis/calcification is present, without any evidence of aortic stenosis.  FINDINGS Left  Ventricle: Left ventricular ejection fraction, by estimation, is 60 to 65%. The left ventricle has normal function. The left ventricle has no regional wall motion abnormalities. The average left ventricular global longitudinal strain is 16.0 %. The global longitudinal strain is abnormal. The left ventricular internal cavity size was normal in size. There is no left ventricular hypertrophy. Left ventricular diastolic parameters were normal.  Right Ventricle: The right ventricular size is normal. Right ventricular systolic function is normal.  Left Atrium: Left atrial size was normal in size.  Right Atrium: Right atrial size was normal in size.  Pericardium: There is no evidence of pericardial effusion.  Mitral Valve: The mitral valve is normal in structure. Trivial mitral valve regurgitation. No evidence of mitral valve stenosis.  Tricuspid Valve: The tricuspid valve is normal in structure. Tricuspid valve regurgitation is mild . No evidence of tricuspid stenosis.  Aortic Valve: The aortic valve is tricuspid. Aortic valve regurgitation is mild. Aortic regurgitation PHT measures 444 msec. Aortic valve sclerosis/calcification is present, without any evidence of aortic stenosis.  Pulmonic Valve: The pulmonic valve was normal in structure. Pulmonic valve regurgitation is mild. No evidence of pulmonic stenosis.  Aorta: The aortic root is normal in size and structure.  Venous: The inferior vena cava was not well visualized.  IAS/Shunts: The interatrial septum is aneurysmal. No atrial level shunt detected by color flow Doppler.   LEFT VENTRICLE PLAX 2D LVIDd:         3.20 cm   Diastology LVIDs:         2.00 cm   LV e' medial:    8.70 cm/s LV PW:         1.00 cm   LV E/e' medial:  10.8 LV IVS:        1.00 cm   LV e' lateral:   10.80 cm/s LVOT diam:     1.70 cm   LV E/e' lateral: 8.7 LV SV:         55 LV SV Index:   33        2D Longitudinal Strain LVOT Area:     2.27 cm  2D Strain GLS  (A2C):   15.0 % 2D Strain GLS (A3C):   15.3 % 2D Strain GLS (A4C):   17.8 % 2D Strain GLS Avg:     16.0 %  RIGHT VENTRICLE RV S prime:     14.50 cm/s TAPSE (M-mode): 1.6 cm RVSP:           26.8 mmHg  LEFT ATRIUM             Index        RIGHT ATRIUM  Index LA diam:        2.90 cm 1.71 cm/m   RA Pressure: 3.00 mmHg LA Vol (A2C):   37.6 ml 22.17 ml/m  RA Area:     7.33 cm LA Vol (A4C):   42.1 ml 24.83 ml/m  RA Volume:   12.10 ml  7.14 ml/m LA Biplane Vol: 42.9 ml 25.30 ml/m AORTIC VALVE LVOT Vmax:   111.00 cm/s LVOT Vmean:  71.100 cm/s LVOT VTI:    0.244 m AI PHT:      444 msec  AORTA Ao Root diam: 2.90 cm Ao Asc diam:  2.80 cm  MITRAL VALVE                TRICUSPID VALVE MV Area (PHT): 4.96 cm     TR Peak grad:   23.8 mmHg MV Decel Time: 153 msec     TR Vmax:        244.00 cm/s MV E velocity: 94.30 cm/s   Estimated RAP:  3.00 mmHg MV A velocity: 104.00 cm/s  RVSP:           26.8 mmHg MV E/A ratio:  0.91 SHUNTS Systemic VTI:  0.24 m Systemic Diam: 1.70 cm  Olga Millers MD Electronically signed by Olga Millers MD Signature Date/Time: 11/18/2022/4:35:27 PM    Final    MONITORS  CARDIAC EVENT MONITOR 02/16/2018  Narrative NSR No significant arrhythmias          Recent Labs: 10/30/2022: BNP 96.0  Recent Lipid Panel No results found for: "CHOL", "TRIG", "HDL", "CHOLHDL", "VLDL", "LDLCALC", "LDLDIRECT"  History of Present Illness    82 year old female with the above past medical history including syncope, bilateral lower extremity edema, aortic insufficiency, hypertension, hyperlipidemia, restless leg syndrome, OA, and GERD.  She was initially referred to Dr. Antoine Poche in 2019 for the evaluation of syncope.  She had a negative workup including a negative ETT, cardiac event monitor and echocardiogram were unremarkable.  She was last seen in the office on 10/30/2022 and noted increased dyspnea on exertion, bilateral lower extremity edema.  BNP was  normal.  Repeat echocardiogram EF 60 to 65%, normal LV function, no RWMA, normal RV, trivial mitral valve regurgitation, mild aortic valve regurgitation.  She was given Lasix 20 mg daily as needed.  PFTs were mildly abnormal.  She was referred to pulmonology for further evaluation of shortness of breath.  She presents today for follow-up accompanied by her daughter.  Since her last visit she has been stable from a cardiac standpoint.  She continues to note dyspnea on exertion, nonpitting, dependent bilateral lower extremity edema.  She denies chest pain, palpitations, dizziness, PND, orthopnea, weight gain.  Overall, her symptoms are unchanged from prior visits.  Home Medications    Current Outpatient Medications  Medication Sig Dispense Refill   Acetaminophen 500 MG capsule Take 500 mg by mouth as needed.     Ascorbic Acid (VITAMIN C) 500 MG CAPS Take 500 mg by mouth daily at 6 (six) AM.     buPROPion (WELLBUTRIN XL) 150 MG 24 hr tablet Take 150 mg by mouth daily.     Calcium-Magnesium-Zinc (CAL-MAG-ZINC PO) Take 1 tablet by mouth daily.     Cholecalciferol (VITAMIN D) 50 MCG (2000 UT) CAPS Take 2,000 Units by mouth daily.     lisinopril (PRINIVIL,ZESTRIL) 5 MG tablet Take 5 mg by mouth daily.     Multiple Vitamins-Minerals (CENTRUM SILVER ADULT 50+ PO) Take 1 tablet by mouth daily.     rOPINIRole (  REQUIP) 1 MG tablet Take 1 mg by mouth at bedtime.     sertraline (ZOLOFT) 100 MG tablet Take 100 mg by mouth daily.     vitamin B-12 (CYANOCOBALAMIN) 1000 MCG tablet Take 1,000 mcg by mouth 4 (four) times a week.      clonazePAM (KLONOPIN) 1 MG tablet Take 1 mg by mouth at bedtime.  (Patient not taking: Reported on 12/30/2022)     furosemide (LASIX) 20 MG tablet Take 1 tablet (20 mg total) by mouth daily as needed. (Patient not taking: Reported on 12/30/2022) 30 tablet 3   HYDROcodone-acetaminophen (NORCO/VICODIN) 5-325 MG tablet Take 1-2 tablets by mouth every 4 (four) hours as needed for moderate  pain (pain score 4-6). (Patient not taking: Reported on 12/30/2022) 30 tablet 0   ondansetron (ZOFRAN ODT) 4 MG disintegrating tablet Take 1 tablet (4 mg total) by mouth every 8 (eight) hours as needed for nausea or vomiting. (Patient not taking: Reported on 12/30/2022) 20 tablet 0   OVER THE COUNTER MEDICATION Take 1 capsule by mouth 3 (three) times a week. Blood Builder- contains Vitamin C, Folate, Vitamin B12, and Iron. Takes on Wed and Sunday. (Patient not taking: Reported on 12/30/2022)     Red Yeast Rice 600 MG CAPS Take 1,200 mg by mouth in the morning and at bedtime. (Patient not taking: Reported on 12/30/2022)     No current facility-administered medications for this visit.     Review of Systems    She denies chest pain, palpitations, pnd, orthopnea, n, v, dizziness, syncope, edema, weight gain, or early satiety. All other systems reviewed and are otherwise negative except as noted above.   Physical Exam    VS:  BP 122/70 (BP Location: Left Arm, Patient Position: Sitting, Cuff Size: Normal)   Pulse 84   Ht 5\' 1"  (1.549 m)   Wt 149 lb 3.2 oz (67.7 kg)   SpO2 96%   BMI 28.19 kg/m   GEN: Well nourished, well developed, in no acute distress. HEENT: normal. Neck: Supple, no JVD, carotid bruits, or masses. Cardiac: RRR, no murmurs, rubs, or gallops. No clubbing, cyanosis, mild nonpitting bilateral lower extremity edema.  Radials/DP/PT 2+ and equal bilaterally.  Respiratory:  Respirations regular and unlabored, clear to auscultation bilaterally. GI: Soft, nontender, nondistended, BS + x 4. MS: no deformity or atrophy. Skin: warm and dry, no rash. Neuro:  Strength and sensation are intact. Psych: Normal affect.  Accessory Clinical Findings    ECG personally reviewed by me today -    - no EKG in office today.    Lab Results  Component Value Date   WBC 6.8 06/26/2020   HGB 10.4 (L) 06/26/2020   HCT 33.4 (L) 06/26/2020   MCV 96.8 06/26/2020   PLT 291 06/26/2020   Lab Results   Component Value Date   CREATININE 0.59 06/26/2020   BUN 6 (L) 06/26/2020   NA 137 06/26/2020   K 3.7 06/26/2020   CL 107 06/26/2020   CO2 22 06/26/2020   Lab Results  Component Value Date   ALT 11 06/26/2020   AST 15 06/26/2020   ALKPHOS 60 06/26/2020   BILITOT 0.1 (L) 06/26/2020   No results found for: "CHOL", "HDL", "LDLCALC", "LDLDIRECT", "TRIG", "CHOLHDL"  Lab Results  Component Value Date   HGBA1C 5.3 06/04/2020    Assessment & Plan    1. Shortness of breath/bilateral lower extremity edema: Prior ETT was negative for ischemia.  Repeat echo in 10/2022 showed EF 60 to 65%,  normal LV function, no RWMA, normal RV, trivial mitral valve regurgitation, mild aortic valve regurgitation.  BNP was normal at the time. PFTs were mildly abnormal.  She continues to note dyspnea on exertion, unchanged from prior visits.  She denies chest pain.  She has stable nonpitting dependent bilateral lower extremity edema, denies PND, orthopnea, weight gain.  She has not taken any Lasix. Pending pulmonology evaluation.  Recommended compression, elevation as her swelling appears to be largely dependent.  Reviewed daily weights, sodium recommendations.  Continue Lasix as needed.  2. Aortic insufficiency: Mild on most recent echo. Consider repeat echocardiogram as clinically indicated.   3. Hypertension: BP well controlled. Continue current antihypertensive regimen.   4. Disposition: Follow-up in 4-6 months, sooner if needed.       Joylene Grapes, NP 12/30/2022, 4:27 PM

## 2022-12-30 NOTE — Patient Instructions (Signed)
Medication Instructions:  Your physician recommends that you continue on your current medications as directed. Please refer to the Current Medication list given to you today.  *If you need a refill on your cardiac medications before your next appointment, please call your pharmacy*   Lab Work: NONE ordered at this time of appointment     Testing/Procedures: NONE ordered at this time of appointment     Follow-Up: At [facility], you and your health needs are our priority.  As part of our continuing mission to provide you with exceptional heart care, we have created designated Provider Care Teams.  These Care Teams include your primary Cardiologist (physician) and Advanced Practice Providers (APPs -  Physician Assistants and Nurse Practitioners) who all work together to provide you with the care you need, when you need it.  We recommend signing up for the patient portal called "MyChart".  Sign up information is provided on this After Visit Summary.  MyChart is used to connect with patients for Virtual Visits (Telemedicine).  Patients are able to view lab/test results, encounter notes, upcoming appointments, etc.  Non-urgent messages can be sent to your provider as well.   To learn more about what you can do with MyChart, go to [email or web address].    Your next appointment:   4-6 month(s)  Provider:   Rollene Rotunda, MD

## 2023-04-01 DIAGNOSIS — I700 Atherosclerosis of aorta: Secondary | ICD-10-CM | POA: Diagnosis not present

## 2023-04-01 DIAGNOSIS — E785 Hyperlipidemia, unspecified: Secondary | ICD-10-CM | POA: Diagnosis not present

## 2023-04-01 DIAGNOSIS — D509 Iron deficiency anemia, unspecified: Secondary | ICD-10-CM | POA: Diagnosis not present

## 2023-04-01 DIAGNOSIS — Z0000 Encounter for general adult medical examination without abnormal findings: Secondary | ICD-10-CM | POA: Diagnosis not present

## 2023-04-01 DIAGNOSIS — I509 Heart failure, unspecified: Secondary | ICD-10-CM | POA: Diagnosis not present

## 2023-04-01 DIAGNOSIS — E538 Deficiency of other specified B group vitamins: Secondary | ICD-10-CM | POA: Diagnosis not present

## 2023-04-01 DIAGNOSIS — I10 Essential (primary) hypertension: Secondary | ICD-10-CM | POA: Diagnosis not present

## 2023-04-01 DIAGNOSIS — E559 Vitamin D deficiency, unspecified: Secondary | ICD-10-CM | POA: Diagnosis not present

## 2023-04-01 DIAGNOSIS — R7303 Prediabetes: Secondary | ICD-10-CM | POA: Diagnosis not present

## 2023-04-07 ENCOUNTER — Ambulatory Visit: Payer: Medicare Other | Admitting: Internal Medicine

## 2023-04-07 ENCOUNTER — Encounter: Payer: Self-pay | Admitting: Internal Medicine

## 2023-04-07 VITALS — BP 131/82 | HR 88 | Ht 60.0 in | Wt 150.0 lb

## 2023-04-07 DIAGNOSIS — T464X5A Adverse effect of angiotensin-converting-enzyme inhibitors, initial encounter: Secondary | ICD-10-CM

## 2023-04-07 DIAGNOSIS — R058 Other specified cough: Secondary | ICD-10-CM

## 2023-04-07 DIAGNOSIS — R053 Chronic cough: Secondary | ICD-10-CM

## 2023-04-07 DIAGNOSIS — R0602 Shortness of breath: Secondary | ICD-10-CM

## 2023-04-07 MED ORDER — LOSARTAN POTASSIUM 50 MG PO TABS: 100.0000 mg | ORAL_TABLET | Freq: Every day | ORAL | 1 refills | Status: DC

## 2023-04-07 NOTE — Patient Instructions (Addendum)
It was a pleasure to see you today!  Please call [phone] if issues or concerns arise. You can also send [place] a message through MyChart, but but aware that this is not to be used for urgent issues and it may take up to 5-7 days to receive a reply. Please be aware that you will likely be able to view your results before I have a chance to respond to them. Please give [place] 5 business days to respond to any non-urgent results.    You have a cough which could be related to lisinopril. Please stop the lisinopril 20 mg and switch to losartan 50 mg daily. We can see if this helps your cough.   Your shortness of breath is most likely related to your postural changes and kyphosis from getting older and having weak bones. Continue to stay as active as you can.   If your cough doesn't improve I would recommend treating your post nasal drainage to start to see if this helps.   I am happy to see you back again at any time.

## 2023-04-07 NOTE — Progress Notes (Signed)
CHLOÀ VARUGHESE    [id number]    November 02, 1940  Primary Care Physician:Morrow, Clifton Custard, MD  Referring Physician: Rollene Rotunda, MD [address] [place],  [place] [address] Reason for Consultation: dyspnea Date of Consultation: 04/07/2023  Chief complaint:   Chief Complaint  Patient presents with   Consult    Pft f/u 9/30, pt states she is sob and coughing      HPI: Lisa Morton is a 83 y.o. woman who presents for new patient evaluation of dyspnea.  Past medical history of HTN on lisinopril.  Symptoms going on for about a year and progressive. She does have dyspnea on exertion, improved with rest.Denies chest tightness, wheezing. Ambulation complicated by mobility/balance issues.  Has occasional cough with tickle in her throat. She has been on lisinopril for many years. She coughs everyday. Daughter notices it. She does get heart burn when she eats a heavier meal in the evening.   Denies pneumonia or bronchitis. Never tried any breathing treatments or inhalers. She did have   Social history:  Occupation: retired, used to work in Engineering geologist.  Exposures: lives independently Smoking history: never smoker, no passive smoke exposure.   Social History   Occupational History   Not on file  Tobacco Use   Smoking status: Never   Smokeless tobacco: Never  Vaping Use   Vaping status: Never Used  Substance and Sexual Activity   Alcohol use: No   Drug use: No   Sexual activity: Not on file    Relevant family history:  Family History  Problem Relation Age of Onset   Cancer Mother    Heart attack Father        No details died at age 29   Bipolar disorder Sister    Breast cancer Maternal Grandmother     Past Medical History:  Diagnosis Date   Chronic diastolic CHF (congestive heart failure) (HCC) 06/23/2020   Closed fracture of left proximal humerus 06/04/2020   Depression    GERD (gastroesophageal reflux disease)    Rare   History of migraine     History of syncope    Full negative cardiac workup   Hyperlipemia    Hypertension    Numbness    Left heel, sharp itching pain   OA (osteoarthritis)    lumbar spine, left hip   Pre-diabetes    Restless leg syndrome    on klonidipin    Past Surgical History:  Procedure Laterality Date   ABDOMINAL HYSTERECTOMY  1984   CATARACT EXTRACTION  07/2011   CESAREAN SECTION     x2   COLONOSCOPY     CYSTOCELE REPAIR  01/26/2012   Procedure: ANTERIOR REPAIR (CYSTOCELE);  Surgeon: Geryl Rankins, MD;  Location: [facility];  Service: Gynecology;  Laterality: N/A;   REVERSE SHOULDER ARTHROPLASTY Left 06/04/2020   Procedure: REVERSE SHOULDER ARTHROPLASTY;  Surgeon: Ernest Mallick, MD;  Location: [facility];  Service: Orthopedics;  Laterality: Left;  with block     Physical Exam: Blood pressure 131/82, pulse 88, height 5' (1.524 m), weight 150 lb (68 kg), SpO2 98%. Gen:      No acute distress, elderly, kyphosis ENT:  mild nasal congestion, no nasal polyps, mucus membranes moist Lungs:    No increased respiratory effort, symmetric chest wall excursion, clear to auscultation bilaterally, no wheezes or crackles CV:         Regular rate and rhythm; no murmurs, rubs, or gallops.  No pedal edema Abd:      + bowel sounds; soft, non-tender; no distension MSK: no acute synovitis of DIP or PIP joints, no mechanics hands.  Skin:      Warm and dry; no rashes Neuro: normal speech, no focal facial asymmetry Psych: alert and oriented x3, normal mood and affect   Data Reviewed/Medical Decision Making:  Independent interpretation of tests: Imaging:  Review of patient's CT A/P April 2022 lung window images revealed bibasilar atelectasis, patulous esophagus. The patient's images have been independently reviewed by me.    PFTs: I have personally reviewed the patient's PFTs and no airflow limitation. Mild restriction to ventilation, unable to interpret DLCO.      Latest Ref Rng & Units 12/15/2022    3:12 PM   PFT Results  FVC-Predicted Pre % 91   FVC-Post L 1.98   FVC-Predicted Post % 95   Pre FEV1/FVC % % 80   Post FEV1/FCV % % 79   FEV1-Pre L 1.52   FEV1-Predicted Pre % 100   FEV1-Post L 1.55   DLCO uncorrected ml/min/mmHg 11.03   DLCO UNC% % 66   DLVA Predicted % 96   TLC L 3.47   TLC % Predicted % 75   RV % Predicted % 64     Labs:  Lab Results  Component Value Date   WBC 6.8 06/26/2020   HGB 10.4 (L) 06/26/2020   HCT 33.4 (L) 06/26/2020   MCV 96.8 06/26/2020   PLT 291 06/26/2020   Lab Results  Component Value Date   NA 137 06/26/2020   K 3.7 06/26/2020   CL 107 06/26/2020   CO2 22 06/26/2020     Immunization status:  Immunization History  Administered Date(s) Administered   Influenza, High Dose Seasonal PF 12/23/2017     I reviewed prior external note(s) from cardiology  I reviewed the result(s) of the labs and imaging as noted above.   I have ordered     Assessment:  Dyspnea Chronic cough while on ace inhibitor.  Mild restriction to ventilation likely secondary to body habitus/kyphosis  Plan/Recommendations: I did offer her follow up appointment, but she and her daughter would like to wait and see how things to with this change and follow up as needed. Reassuring physical exam and vital signs without crackles, hypoxemia.  You have a cough which could be related to lisinopril. Please stop the lisinopril 20 mg and switch to losartan 50 mg daily. We can see if this helps your cough. Measure your blood pressure for goal< 130/90.   Your shortness of breath is most likely related to your postural changes and kyphosis from getting older and having weak bones. Continue to stay as active as you can.   If your cough doesn't improve I would recommend treating your post nasal drainage to start to see if this helps.   I am happy to see you back again at any time.    Return if symptoms worsen or fail to improve.  Durel Salts, MD Pulmonary and Critical Care  [facility] Office:[phone]  CC: Rollene Rotunda, MD

## 2023-05-17 DIAGNOSIS — R0602 Shortness of breath: Secondary | ICD-10-CM | POA: Insufficient documentation

## 2023-05-17 DIAGNOSIS — R600 Localized edema: Secondary | ICD-10-CM | POA: Insufficient documentation

## 2023-05-17 NOTE — Progress Notes (Unsigned)
  Cardiology Office Note:   Date:  05/19/2023  ID:  Lisa Morton, DOB August 30, 1940, MRN [id number] PCP: Lisa Has, MD  [facility] Providers Cardiologist:  Rollene Rotunda, MD {  History of Present Illness:   Lisa Morton is a 83 y.o. female who was referred by Lisa Mylar, MD for evaluation of syncope in 2019.  I have not seen her since 2020  She had a negative work up including a POET (Plain Old Exercise Treadmill), event monitor and echo.   She was referred for evaluation of SOB and edema.     Since I last saw her she Morton done okay.  I gave her Lasix previously for some swelling and shortness of breath but she never actually took it.  She thinks her breathing and her leg swelling and been okay.  She is certainly not describing resting shortness of breath, PND or orthopnea.  She is not having palpitations, presyncope or syncope.  She is not having any chest pressure, neck or arm discomfort.  Of note she is not taking Cozaar though her list says 50 mg twice a day.  She Morton been taking 5 mg of lisinopril the last couple of days and she had this at home.  ROS: As stated in the HPI and negative for all other systems.  Studies Reviewed:    EKG:   EKG Interpretation Date/Time:  Tuesday May 19 2023 16:26:00 EST Ventricular Rate:  86 PR Interval:  150 QRS Duration:  88 QT Interval:  370 QTC Calculation: 442 R Axis:   -4  Text Interpretation: Normal sinus rhythm When compared with ECG of 30-Oct-2022 13:03, No significant change since last tracing Confirmed by Rollene Rotunda ([id number]) on 05/19/2023 4:27:39 PM     Risk Assessment/Calculations:     Physical Exam:   VS:  BP 118/87 (Patient Position: Sitting, Cuff Size: Normal)   Pulse 86   Ht 5' (1.524 m)   Wt 147 lb 9.6 oz (67 kg)   SpO2 98%   BMI 28.83 kg/m    Wt Readings from Last 3 Encounters:  05/19/23 147 lb 9.6 oz (67 kg)  04/07/23 150 lb (68 kg)  12/30/22 149 lb 3.2 oz (67.7 kg)     GEN: Well nourished, well  developed in no acute distress NECK: No JVD; No carotid bruits CARDIAC: RRR, no murmurs, rubs, gallops RESPIRATORY:  Clear to auscultation without rales, wheezing or rhonchi  ABDOMEN: Soft, non-tender, non-distended EXTREMITIES: Trace leg edema; No deformity   ASSESSMENT AND PLAN:    SOB/edema: This actually is not significant and she does not want to take diuretic.  It is probably more related to weight and deconditioning and the fact that she Morton minimal ambulation in her legs on the ground a lot.  We decided to manage this conservatively and no further testing.  She will reduce her salt and keep her legs elevated bated.   HTN: Her blood pressure is actually controlled today on 5 mg of lisinopril.  This was an old prescription.  She did develop cough with ACE inhibitor's in the past apparently.  She was given a prescription for 50 mg twice a day of Cozaar but I am worried that this might be too high.  Going to change this to 25 mg once a day and she can keep a blood pressure diary.    Follow up with me as needed.  Signed, Rollene Rotunda, MD

## 2023-05-19 ENCOUNTER — Encounter: Payer: Self-pay | Admitting: Cardiology

## 2023-05-19 ENCOUNTER — Ambulatory Visit: Payer: Medicare Other | Attending: Cardiology | Admitting: Cardiology

## 2023-05-19 VITALS — BP 118/87 | HR 86 | Ht 60.0 in | Wt 147.6 lb

## 2023-05-19 DIAGNOSIS — R0602 Shortness of breath: Secondary | ICD-10-CM | POA: Diagnosis not present

## 2023-05-19 DIAGNOSIS — I10 Essential (primary) hypertension: Secondary | ICD-10-CM | POA: Diagnosis not present

## 2023-05-19 DIAGNOSIS — R600 Localized edema: Secondary | ICD-10-CM

## 2023-05-19 MED ORDER — LOSARTAN POTASSIUM 25 MG PO TABS: 25.0000 mg | ORAL_TABLET | Freq: Every day | ORAL | 3 refills | Status: AC

## 2023-05-19 NOTE — Patient Instructions (Signed)
 Medication Instructions:  Start losartan 25 mg by mouth daily. New script sent to [facility]. Stop taking furosemide.  *If you need a refill on your cardiac medications before your next appointment, please call your pharmacy*  Follow-Up: At [facility], you and your health needs are our priority.  As part of our continuing mission to provide you with exceptional heart care, we have created designated Provider Care Teams.  These Care Teams include your primary Cardiologist (physician) and Advanced Practice Providers (APPs -  Physician Assistants and Nurse Practitioners) who all work together to provide you with the care you need, when you need it.  We recommend signing up for the patient portal called "MyChart".  Sign up information is provided on this After Visit Summary.  MyChart is used to connect with patients for Virtual Visits (Telemedicine).  Patients are able to view lab/test results, encounter notes, upcoming appointments, etc.  Non-urgent messages can be sent to your provider as well.   To learn more about what you can do with MyChart, go to [email or web address].    Your next appointment:   As needed   Provider:   Rollene Rotunda, MD     Other Instructions  Please take and record blood pressure daily on the BP log form provided by the office nurse.

## 2023-09-14 DIAGNOSIS — I10 Essential (primary) hypertension: Secondary | ICD-10-CM | POA: Diagnosis not present

## 2023-09-14 DIAGNOSIS — I509 Heart failure, unspecified: Secondary | ICD-10-CM | POA: Diagnosis not present

## 2023-09-14 DIAGNOSIS — E785 Hyperlipidemia, unspecified: Secondary | ICD-10-CM | POA: Diagnosis not present

## 2023-09-29 DIAGNOSIS — R609 Edema, unspecified: Secondary | ICD-10-CM | POA: Diagnosis not present

## 2023-09-29 DIAGNOSIS — I10 Essential (primary) hypertension: Secondary | ICD-10-CM | POA: Diagnosis not present

## 2023-09-29 DIAGNOSIS — I509 Heart failure, unspecified: Secondary | ICD-10-CM | POA: Diagnosis not present

## 2023-09-29 DIAGNOSIS — R7303 Prediabetes: Secondary | ICD-10-CM | POA: Diagnosis not present

## 2023-09-29 DIAGNOSIS — R0602 Shortness of breath: Secondary | ICD-10-CM | POA: Diagnosis not present

## 2023-09-29 DIAGNOSIS — Z7409 Other reduced mobility: Secondary | ICD-10-CM | POA: Diagnosis not present

## 2023-10-15 DIAGNOSIS — E785 Hyperlipidemia, unspecified: Secondary | ICD-10-CM | POA: Diagnosis not present

## 2023-10-15 DIAGNOSIS — I10 Essential (primary) hypertension: Secondary | ICD-10-CM | POA: Diagnosis not present

## 2023-10-15 DIAGNOSIS — I509 Heart failure, unspecified: Secondary | ICD-10-CM | POA: Diagnosis not present

## 2023-11-15 DIAGNOSIS — I10 Essential (primary) hypertension: Secondary | ICD-10-CM | POA: Diagnosis not present

## 2023-11-15 DIAGNOSIS — E785 Hyperlipidemia, unspecified: Secondary | ICD-10-CM | POA: Diagnosis not present

## 2023-11-15 DIAGNOSIS — I509 Heart failure, unspecified: Secondary | ICD-10-CM | POA: Diagnosis not present

## 2023-12-15 DIAGNOSIS — I509 Heart failure, unspecified: Secondary | ICD-10-CM | POA: Diagnosis not present

## 2023-12-15 DIAGNOSIS — E785 Hyperlipidemia, unspecified: Secondary | ICD-10-CM | POA: Diagnosis not present

## 2023-12-15 DIAGNOSIS — I10 Essential (primary) hypertension: Secondary | ICD-10-CM | POA: Diagnosis not present

## 2024-01-14 DIAGNOSIS — K5900 Constipation, unspecified: Secondary | ICD-10-CM | POA: Diagnosis not present
